# Patient Record
Sex: Female | Born: 1960 | Race: White | Hispanic: No | Smoking: Never smoker
Health system: Southern US, Community
[De-identification: ages and names within clinical notes are randomized; demographics above are authoritative.]

## PROBLEM LIST (undated history)

## (undated) DIAGNOSIS — F909 Attention-deficit hyperactivity disorder, unspecified type: Secondary | ICD-10-CM

## (undated) DIAGNOSIS — I10 Essential (primary) hypertension: Secondary | ICD-10-CM

## (undated) DIAGNOSIS — M1990 Unspecified osteoarthritis, unspecified site: Secondary | ICD-10-CM

## (undated) HISTORY — PX: KNEE SURGERY: SHX244

## (undated) HISTORY — PX: CARPAL TUNNEL RELEASE: SHX101

---

## 1998-03-07 ENCOUNTER — Ambulatory Visit (HOSPITAL_BASED_OUTPATIENT_CLINIC_OR_DEPARTMENT_OTHER): Admission: RE | Admit: 1998-03-07 | Discharge: 1998-03-07 | Payer: Self-pay | Admitting: *Deleted

## 1998-10-23 ENCOUNTER — Ambulatory Visit (HOSPITAL_COMMUNITY): Admission: RE | Admit: 1998-10-23 | Discharge: 1998-10-23 | Payer: Self-pay | Admitting: Obstetrics and Gynecology

## 1998-10-23 ENCOUNTER — Encounter: Payer: Self-pay | Admitting: Obstetrics and Gynecology

## 1998-10-30 ENCOUNTER — Ambulatory Visit (HOSPITAL_COMMUNITY): Admission: RE | Admit: 1998-10-30 | Discharge: 1998-10-30 | Payer: Self-pay | Admitting: Obstetrics and Gynecology

## 1998-10-30 ENCOUNTER — Encounter: Payer: Self-pay | Admitting: Obstetrics and Gynecology

## 1999-12-03 ENCOUNTER — Other Ambulatory Visit: Admission: RE | Admit: 1999-12-03 | Discharge: 1999-12-03 | Payer: Self-pay | Admitting: Obstetrics and Gynecology

## 2000-12-30 ENCOUNTER — Other Ambulatory Visit: Admission: RE | Admit: 2000-12-30 | Discharge: 2000-12-30 | Payer: Self-pay | Admitting: Obstetrics and Gynecology

## 2002-01-31 ENCOUNTER — Other Ambulatory Visit: Admission: RE | Admit: 2002-01-31 | Discharge: 2002-01-31 | Payer: Self-pay | Admitting: Obstetrics and Gynecology

## 2003-02-21 ENCOUNTER — Other Ambulatory Visit: Admission: RE | Admit: 2003-02-21 | Discharge: 2003-02-21 | Payer: Self-pay | Admitting: Obstetrics and Gynecology

## 2004-04-25 ENCOUNTER — Other Ambulatory Visit: Admission: RE | Admit: 2004-04-25 | Discharge: 2004-04-25 | Payer: Self-pay | Admitting: Obstetrics and Gynecology

## 2005-08-04 ENCOUNTER — Other Ambulatory Visit: Admission: RE | Admit: 2005-08-04 | Discharge: 2005-08-04 | Payer: Self-pay | Admitting: Obstetrics and Gynecology

## 2008-07-03 ENCOUNTER — Ambulatory Visit (HOSPITAL_BASED_OUTPATIENT_CLINIC_OR_DEPARTMENT_OTHER): Admission: RE | Admit: 2008-07-03 | Discharge: 2008-07-03 | Payer: Self-pay | Admitting: Family Medicine

## 2008-07-07 ENCOUNTER — Ambulatory Visit: Payer: Self-pay | Admitting: Internal Medicine

## 2008-08-24 ENCOUNTER — Encounter: Admission: RE | Admit: 2008-08-24 | Discharge: 2008-08-24 | Payer: Self-pay | Admitting: Gastroenterology

## 2011-01-27 NOTE — Procedures (Signed)
NAME:  Janet Watson, Janet Watson NO.:  [id number]   MEDICAL RECORD NO.:  [id number]          PATIENT TYPE:  OUT   LOCATION:  SLEEP CENTER                 FACILITY:  [facility]   PHYSICIAN:  Clinton D. Maple Hudson, MD, FCCP, FACPDATE OF BIRTH:  01/30/1963   DATE OF STUDY:  07/03/2008                            NOCTURNAL POLYSOMNOGRAM   REFERRING PHYSICIAN:  Lillia Carmel, M.D.   INDICATION FOR STUDY:  Insomnia with obstructive sleep apnea.  Epworth  sleepiness score 18/24, BMI 39.7, weight 246 pounds, height 66 inches,  and neck 14.5 inches.   MEDICATIONS:  Home medications charted and reviewed.   SLEEP ARCHITECTURE:  Total sleep time 310 minutes with sleep efficiency  81.9%.  Stage I was 3.7%.  Stage II 72.8%.  Stage III 4.5%.  Stage REM  19% of total sleep time.  Sleep latency 9 minutes.  REM latency 65  minutes.  Wake after sleep onset 51 minutes.  Arousal index 6.8.  No  bedtime medication was taken.   RESPIRATORY DATA:  Apnea-hypopnea index (AHI) 0.4 per hour.  A total of  2 events were scored, both hypopneas associated with nonsupine sleep  position while in REM.   OXYGEN DATA:  Moderate snoring with oxygen desaturation to a nadir of  90%.  Mean oxygen saturation through the study was 94.7% on room air.   CARDIAC DATA:  Normal sinus rhythm.   MOVEMENT/PARASOMNIA:  No significant movement disturbance.  No bathroom  trips.   IMPRESSIONS/RECOMMENDATIONS:  1. Sleep architecture unremarkable for sleep center environment noting      that she awoke and did not regain sleep after 4:30 a.m.  Comparison      to home sleep environment and experience may be useful.  2. Insignificant respiratory sleep disturbance, AHI 0.4 per hour      (normal range 0-5 per hour).  Moderate snoring with oxygen      desaturation to a nadir of 90%.      Clinton D. Maple Hudson, MD, [facility], FACP  Diplomate, Biomedical engineer of Sleep Medicine  Electronically Signed     CDY/MEDQ  D:  07/07/2008 11:21:38   T:  07/07/2008 23:14:16  Job:  [id number]

## 2014-01-11 ENCOUNTER — Encounter: Payer: Self-pay | Admitting: Podiatry

## 2014-01-11 ENCOUNTER — Ambulatory Visit (INDEPENDENT_AMBULATORY_CARE_PROVIDER_SITE_OTHER): Payer: 59 | Admitting: Podiatry

## 2014-01-11 VITALS — BP 122/81 | HR 86 | Resp 16

## 2014-01-11 DIAGNOSIS — B351 Tinea unguium: Secondary | ICD-10-CM

## 2014-01-11 NOTE — Progress Notes (Deleted)
Subjective:      Patient ID: Janet Watson is a 53 y.o. female.  Chief Complaint: HPI ROS    Objective:    Physical Exam  Lab Review:     Assessment:     Dermatophytosis of nail   Plan:     ***

## 2014-01-11 NOTE — Progress Notes (Signed)
   Subjective:    Patient ID: Janet Watson, female    DOB: [birth year], 53 y.o.   MRN: [id number]  HPI Comments: "I have these bad toenails"  Patient c/o thick and discolored 1st and 3rd toenails left for several years. Did injury the toes years ago by dropping a scuba tank on them. The 1st left, medial border, is numb. She has been trimming them down and using tea tree oil.  Interested in laser treatment      Review of Systems  HENT: Positive for sinus pressure, sneezing and tinnitus.   Skin:       Change in nails  Allergic/Immunologic: Positive for environmental allergies and food allergies.  All other systems reviewed and are negative.      Objective:   Physical Exam        Assessment & Plan:

## 2014-01-12 NOTE — Progress Notes (Signed)
Subjective:     Patient ID: Janet Watson, female   DOB: [birth year], 53 y.o.   MRN: [id number]  HPI patient presents stating I have these nails on my left foot that are thick and I'm not sure whether we can do anything about a   Review of Systems  All other systems reviewed and are negative.      Objective:   Physical Exam  Nursing note and vitals reviewed. Constitutional: She is oriented to person, place, and time.  Cardiovascular: Intact distal pulses.   Musculoskeletal: Normal range of motion.  Neurological: She is oriented to person, place, and time.  Skin: Skin is warm.   neurovascular status found to be intact with patient well oriented and found to have normal muscle strength of the subtalar and midtarsal joint and found to have normal range of motion. Nailbeds 1 and 3 left are mildly thickened but it does appear to be trauma versus true fungal infiltration     Assessment:     Probable trauma to that nailbeds creating discoloration    Plan:     H&P reviewed and today I advised on causes a traumatic nail along with opportunistic fungus. We are going to utilize laser treatment the hallux and third nail on the left foot and I reviewed this with her explaining it may or may not give her relief and curing of her symptoms. Also begin formula 3

## 2014-01-29 ENCOUNTER — Encounter: Payer: Self-pay | Admitting: Podiatry

## 2014-01-29 ENCOUNTER — Ambulatory Visit: Payer: Self-pay | Admitting: Podiatry

## 2014-01-29 VITALS — BP 124/86 | HR 84 | Resp 12

## 2014-01-29 DIAGNOSIS — B351 Tinea unguium: Secondary | ICD-10-CM

## 2014-01-29 NOTE — Progress Notes (Signed)
Subjective:     Patient ID: Janet Watson, female   DOB: August 29, 1961, 53 y.o.   MRN: [id number]  HPI patient presents with nail disease of the big nail and third nail left foot   Review of Systems     Objective:   Physical Exam Neurovascular status intact with thickened big nail third nail left    Assessment:     Mycotic nail and trauma to the hallux and third nail left    Plan:     Laser accomplished today with no issues and reappoint 5 weeks to reevaluate

## 2014-03-05 ENCOUNTER — Ambulatory Visit (INDEPENDENT_AMBULATORY_CARE_PROVIDER_SITE_OTHER): Payer: 59 | Admitting: Podiatry

## 2014-03-05 ENCOUNTER — Encounter: Payer: Self-pay | Admitting: Podiatry

## 2014-03-05 VITALS — BP 157/97 | HR 75 | Resp 16

## 2014-03-05 DIAGNOSIS — B351 Tinea unguium: Secondary | ICD-10-CM

## 2014-03-05 NOTE — Progress Notes (Signed)
Subjective:     Patient ID: Janet Watson, female   DOB: 12-Nov-1960, 53 y.o.   MRN: [id number]  HPI patient presents stating my big toenail and third nail left I think are improving but still have fungal element present   Review of Systems     Objective:   Physical Exam Neurovascular status intact with some yellow numbness to the hallux and third nail left    Assessment:     Continued mycotic infection    Plan:     Today laser applied approximately 800 pulses to the hallux and third nail which was tolerated well

## 2014-06-20 ENCOUNTER — Ambulatory Visit: Payer: 59 | Admitting: Podiatry

## 2014-06-20 DIAGNOSIS — B351 Tinea unguium: Secondary | ICD-10-CM

## 2014-06-20 NOTE — Progress Notes (Signed)
Subjective:     Patient ID: Janet Watson, female   DOB: November 12, 1960, 53 y.o.   MRN: [id number]  HPI patient states that her nails are doing better with a small amount of fungus on the big nail and third nail left   Review of Systems     Objective:   Physical Exam Neurovascular status intact with slight discoloration of the hallux and third nail left foot improved from previous visit    Assessment:     Doing well with nail laser surgery    Plan:     Approximate 1000 pulses to the hallux and third nail left and reappoint as needed

## 2014-09-13 DIAGNOSIS — M79673 Pain in unspecified foot: Secondary | ICD-10-CM

## 2014-10-26 ENCOUNTER — Encounter (HOSPITAL_COMMUNITY): Payer: Self-pay | Admitting: Emergency Medicine

## 2014-10-26 ENCOUNTER — Emergency Department (HOSPITAL_COMMUNITY): Payer: 59

## 2014-10-26 ENCOUNTER — Emergency Department (HOSPITAL_COMMUNITY)
Admission: EM | Admit: 2014-10-26 | Discharge: 2014-10-26 | Disposition: A | Discharge status: Home / Self Care | Payer: 59 | Attending: Emergency Medicine | Admitting: Emergency Medicine

## 2014-10-26 DIAGNOSIS — Y998 Other external cause status: Secondary | ICD-10-CM | POA: Diagnosis not present

## 2014-10-26 DIAGNOSIS — Y9289 Other specified places as the place of occurrence of the external cause: Secondary | ICD-10-CM | POA: Diagnosis not present

## 2014-10-26 DIAGNOSIS — Y9389 Activity, other specified: Secondary | ICD-10-CM | POA: Diagnosis not present

## 2014-10-26 DIAGNOSIS — F909 Attention-deficit hyperactivity disorder, unspecified type: Secondary | ICD-10-CM | POA: Insufficient documentation

## 2014-10-26 DIAGNOSIS — W230XXA Caught, crushed, jammed, or pinched between moving objects, initial encounter: Secondary | ICD-10-CM | POA: Diagnosis not present

## 2014-10-26 DIAGNOSIS — Z79899 Other long term (current) drug therapy: Secondary | ICD-10-CM | POA: Diagnosis not present

## 2014-10-26 DIAGNOSIS — S61216A Laceration without foreign body of right little finger without damage to nail, initial encounter: Secondary | ICD-10-CM | POA: Diagnosis not present

## 2014-10-26 DIAGNOSIS — Z8739 Personal history of other diseases of the musculoskeletal system and connective tissue: Secondary | ICD-10-CM | POA: Diagnosis not present

## 2014-10-26 DIAGNOSIS — S6991XA Unspecified injury of right wrist, hand and finger(s), initial encounter: Secondary | ICD-10-CM | POA: Diagnosis present

## 2014-10-26 DIAGNOSIS — I10 Essential (primary) hypertension: Secondary | ICD-10-CM | POA: Insufficient documentation

## 2014-10-26 HISTORY — DX: Unspecified osteoarthritis, unspecified site: M19.90

## 2014-10-26 HISTORY — DX: Attention-deficit hyperactivity disorder, unspecified type: F90.9

## 2014-10-26 HISTORY — DX: Essential (primary) hypertension: I10

## 2014-10-26 MED ORDER — SULFAMETHOXAZOLE-TRIMETHOPRIM 800-160 MG PO TABS: 1.0000 | ORAL_TABLET | Freq: Two times a day (BID) | ORAL | Status: DC

## 2014-10-26 MED ORDER — LIDOCAINE HCL (PF) 1 % IJ SOLN
5.0000 mL | Freq: Once | INTRAMUSCULAR | Status: AC
  Administered 2014-10-26: 5 mL
  Filled 2014-10-26: qty 5

## 2014-10-26 NOTE — ED Provider Notes (Signed)
CSN: [id number]     Arrival date & time 10/26/14  1019 History  This chart was scribed for Cheron SchaumannLeslie Brevin Mcfadden, VF CorporationPA-C, working with Toy CookeyMegan Docherty, MD by Chestine SporeSoijett Blue, ED Scribe. The patient was seen in room TR09C/TR09C at 10:29 AM.    Chief Complaint  Patient presents with  . Hand Injury     The history is provided by the patient. No language interpreter was used.    HPI Comments: Janet Smackatricia Glassberg is a 54 y.o. female who presents to the Emergency Department complaining of right hand inury onset last night around 10 PM. She reports that she got her hand caught in a car door and she yanked it out. She went to visit her PCP today for the injury. She notes that [facility] She notes that they referred her because of the possibility of joint involvement. She states that she is having associated symptoms of arthralgia and joint swelling. She denies fever, chills, and any other symptoms. 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   Past Medical History  Diagnosis Date  . Hypertension   . ADHD (attention deficit hyperactivity disorder)   . Arthritis    History reviewed. No pertinent past surgical history. No family history on file. History  Substance Use Topics  . Smoking status: Never Smoker   . Smokeless tobacco: Not on file  . Alcohol Use: No   OB History    No data available     Review of Systems  Constitutional: Negative for fever and chills.  Musculoskeletal: Positive for joint swelling and arthralgias.  All other systems reviewed and are negative.    Allergies  Erythromycin  Home Medications   Prior to Admission medications   Medication Sig Start Date End Date Taking? Authorizing Provider  COD LIVER OIL PO Take by mouth.    Historical Provider, MD  hydrochlorothiazide (MICROZIDE) 12.5 MG capsule Take 12.5 mg by mouth daily.    Historical Provider, MD  Hyoscyamine Sulfate (HYOMAX PO) Take by mouth.    Historical Provider, MD  Levothyroxine  Sodium (LEVOTHROID PO) Take by mouth.    Historical Provider, MD  lisdexamfetamine (VYVANSE) 40 MG capsule Take 40 mg by mouth every morning.    Historical Provider, MD  TURMERIC PO Take by mouth.    Historical Provider, MD   BP 152/93 mmHg  Pulse 94  Temp(Src) 97.7 F (36.5 C) (Oral)  SpO2 100%  Physical Exam  Constitutional: She is oriented to person, place, and time. She appears well-developed and well-nourished. No distress.  HENT:  Head: Normocephalic and atraumatic.  Eyes: EOM are normal.  Neck: Neck supple. No tracheal deviation present.  Cardiovascular: Normal rate.   Pulmonary/Chest: Effort normal. No respiratory distress.  Musculoskeletal: Normal range of motion.       Right hand: She exhibits laceration. She exhibits normal range of motion. Normal sensation noted.  Right hand: Triangular shaped laceration dorsal aspect right fifth finger at MCP and web space. NVI. Sensation intact. Full ROM. No evidence of tendon injury.  Neurological: She is alert and oriented to person, place, and time.  Skin: Skin is warm and dry.  Psychiatric: She has a normal mood and affect. Her behavior is normal.  Nursing note and vitals reviewed.   ED Course  LACERATION REPAIR Date/Time: 10/26/2014 11:43 AM Performed by: Elson AreasSOFIA, Kyriaki Moder K Authorized by: Elson AreasSOFIA, Rishon Thilges K Consent: Verbal consent obtained. Consent given by: patient Patient understanding: patient does not state understanding of the procedure being  performed Patient identity confirmed: verbally with patient Time out: Immediately prior to procedure a "time out" was called to verify the correct patient, procedure, equipment, support staff and site/side marked as required. Laceration length: 1 cm Foreign bodies: glass Tendon involvement: none Nerve involvement: none Vascular damage: no Anesthesia: local infiltration Local anesthetic: lidocaine 2% without epinephrine Preparation: Patient was prepped and draped in the usual sterile  fashion. Irrigation solution: saline Debridement: none Degree of undermining: none Skin closure: 5-0 Prolene Number of sutures: 2 Approximation: loose Approximation difficulty: simple Patient tolerance: Patient tolerated the procedure well with no immediate complications   (including critical care time) DIAGNOSTIC STUDIES: Oxygen Saturation is 100% on room air, normal by my interpretation.    COORDINATION OF CARE: 10:32 AM-Discussed treatment plan which includes right hand X-ray, laceration repair, abx with pt at bedside and pt agreed to plan.   Labs Review Labs Reviewed - No data to display  Imaging Review Dg Finger Little Right  10/26/2014   CLINICAL DATA:  Finger slammed in car door. Laceration over the MCP joint. Swelling and discomfort.  EXAM: RIGHT LITTLE FINGER 2+V  COMPARISON:  None.  FINDINGS: Negative for a fracture or dislocation. There is a small calcification or density along the ulnar aspect of the ring finger proximal phalanx. No significant degenerative disease. Difficult to evaluate for soft tissue swelling.  IMPRESSION: No acute bone abnormality.   Electronically Signed   By: Richarda Overlie M.D.   On: 10/26/2014 10:58     EKG Interpretation None      MDM   Final diagnoses:  Laceration of right little finger without foreign body without damage to nail, initial encounter     I personally performed the services described in this documentation, which was scribed in my presence. The recorded information has been reviewed and is accurate.    Lonia Skinner Cantrall, PA-C 10/26/14 1141  Lonia Skinner Tiro, PA-C 10/26/14 1146  Toy Cookey, MD 10/29/14 1024

## 2014-10-26 NOTE — Discharge Instructions (Signed)

## 2014-10-26 NOTE — ED Notes (Signed)
Patient states slammed her hand in car door last night around 1000pm.   Patient has open laceration just below pinky finger on R hand.   Patient [facility] and was sent here by her primary because "they said I had joint involvement and needed a hand specialist".

## 2015-02-26 ENCOUNTER — Ambulatory Visit (INDEPENDENT_AMBULATORY_CARE_PROVIDER_SITE_OTHER): Payer: 59 | Admitting: Podiatry

## 2015-02-26 ENCOUNTER — Ambulatory Visit: Payer: 59

## 2015-02-26 ENCOUNTER — Encounter: Payer: Self-pay | Admitting: Podiatry

## 2015-02-26 VITALS — BP 133/89 | HR 89 | Resp 12

## 2015-02-26 DIAGNOSIS — M205X1 Other deformities of toe(s) (acquired), right foot: Secondary | ICD-10-CM | POA: Diagnosis not present

## 2015-02-26 DIAGNOSIS — M722 Plantar fascial fibromatosis: Secondary | ICD-10-CM

## 2015-02-26 MED ORDER — TRIAMCINOLONE ACETONIDE 10 MG/ML IJ SUSP
10.0000 mg | Freq: Once | INTRAMUSCULAR | Status: AC
  Administered 2015-02-26: 10 mg

## 2015-02-26 NOTE — Progress Notes (Signed)
   Subjective:    Patient ID: Janet Watson, female    DOB: [birth year], 54 y.o.   MRN: [id number]  HPI PT STATED SEEN DR.PETRINITZ AND DIAGNOSED WITH PLANTAR FASCIITIS ON B/L FEET START HURTING FOR 3 MONTHS. FEET ARE GETTING WORSE ESPECIALLY WHEN WALKING. TRIED WEARING THE ORTHOTICS BUT NO HELP.   Review of Systems  Musculoskeletal: Positive for gait problem.       Objective:   Physical Exam        Assessment & Plan:

## 2015-02-26 NOTE — Patient Instructions (Signed)
Plantar Fasciitis  Plantar fasciitis is a common condition that causes foot pain. It is soreness (inflammation) of the band of tough fibrous tissue on the bottom of the foot that runs from the heel bone (calcaneus) to the ball of the foot. The cause of this soreness may be from excessive standing, poor fitting shoes, running on hard surfaces, being overweight, having an abnormal walk, or overuse (this is common in runners) of the painful foot or feet. It is also common in aerobic exercise dancers and ballet dancers.  SYMPTOMS   Most people with plantar fasciitis complain of:   Severe pain in the morning on the bottom of their foot especially when taking the first steps out of bed. This pain recedes after a few minutes of walking.   Severe pain is experienced also during walking following a long period of inactivity.   Pain is worse when walking barefoot or up stairs  DIAGNOSIS    Your caregiver will diagnose this condition by examining and feeling your foot.   Special tests such as X-rays of your foot, are usually not needed.  PREVENTION    Consult a sports medicine professional before beginning a new exercise program.   Walking programs offer a good workout. With walking there is a lower chance of overuse injuries common to runners. There is less impact and less jarring of the joints.   Begin all new exercise programs slowly. If problems or pain develop, decrease the amount of time or distance until you are at a comfortable level.   Wear good shoes and replace them regularly.   Stretch your foot and the heel cords at the back of the ankle (Achilles tendon) both before and after exercise.   Run or exercise on even surfaces that are not hard. For example, asphalt is better than pavement.   Do not run barefoot on hard surfaces.   If using a treadmill, vary the incline.   Do not continue to workout if you have foot or joint problems. Seek professional help if they do not improve.  HOME CARE INSTRUCTIONS     Avoid activities that cause you pain until you recover.   Use ice or cold packs on the problem or painful areas after working out.   Only take over-the-counter or prescription medicines for pain, discomfort, or fever as directed by your caregiver.   Soft shoe inserts or athletic shoes with air or gel sole cushions may be helpful.   If problems continue or become more severe, consult a sports medicine caregiver or your own health care provider. Cortisone is a potent anti-inflammatory medication that may be injected into the painful area. You can discuss this treatment with your caregiver.  MAKE SURE YOU:    Understand these instructions.   Will watch your condition.   Will get help right away if you are not doing well or get worse.  Document Released: 05/26/2001 Document Revised: 11/23/2011 Document Reviewed: 07/25/2008  ExitCare Patient Information 2015 [employer]. This information is not intended to replace advice given to you by your health care provider. Make sure you discuss any questions you have with your health care provider.

## 2015-02-26 NOTE — Progress Notes (Signed)
Subjective:     Patient ID: Janet Watson, female   DOB: 21-Mar-1961, 54 y.o.   MRN: [id number]  HPI patient presents stating that she's getting pains in the bottom of her heels and that she's had pain in her toe joint right over left for a period of time. She is worn orthotics in the past which have worn out and she needs a new pair   Review of Systems  All other systems reviewed and are negative.      Objective:   Physical Exam  Constitutional: She is oriented to person, place, and time.  Cardiovascular: Intact distal pulses.   Musculoskeletal: Normal range of motion.  Neurological: She is oriented to person, place, and time.  Skin: Skin is warm.  Nursing note and vitals reviewed.  neurovascular status intact muscle strength adequate range of motion within normal limits. Patient's noted to have discomfort in the plantar aspect heel right over left with inflammation and fluid buildup and is noted to have limitation of motion first metatarsophalangeal joint right with some crepitus within the joint. 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 Today I injected the plantar fascia bilateral 3 mg Kenalog 5 mg Xylocaine and dispensed fascial brace bilateral. I then scanned for custom orthotic devices and we will see her back 3 weeks to dispense and possibly to reevaluate

## 2015-03-19 ENCOUNTER — Ambulatory Visit: Payer: 59 | Admitting: *Deleted

## 2015-03-19 DIAGNOSIS — M722 Plantar fascial fibromatosis: Secondary | ICD-10-CM

## 2015-03-19 NOTE — Patient Instructions (Signed)

## 2015-03-19 NOTE — Progress Notes (Signed)
Patient ID: Janet Watson, female   DOB: May 22, 1961, 54 y.o.   MRN: [id number] Patient presents for orthotic pick up.  Written and verbal break in and wear instructions given.

## 2015-07-16 ENCOUNTER — Emergency Department (HOSPITAL_COMMUNITY)
Admission: EM | Admit: 2015-07-16 | Discharge: 2015-07-16 | Disposition: A | Discharge status: Home / Self Care | Payer: 59 | Attending: Emergency Medicine | Admitting: Emergency Medicine

## 2015-07-16 ENCOUNTER — Encounter (HOSPITAL_COMMUNITY): Payer: Self-pay | Admitting: Cardiology

## 2015-07-16 ENCOUNTER — Emergency Department (HOSPITAL_COMMUNITY): Payer: 59

## 2015-07-16 DIAGNOSIS — Z792 Long term (current) use of antibiotics: Secondary | ICD-10-CM | POA: Diagnosis not present

## 2015-07-16 DIAGNOSIS — I10 Essential (primary) hypertension: Secondary | ICD-10-CM | POA: Diagnosis not present

## 2015-07-16 DIAGNOSIS — Y93E1 Activity, personal bathing and showering: Secondary | ICD-10-CM | POA: Diagnosis not present

## 2015-07-16 DIAGNOSIS — Y929 Unspecified place or not applicable: Secondary | ICD-10-CM | POA: Insufficient documentation

## 2015-07-16 DIAGNOSIS — W182XXA Fall in (into) shower or empty bathtub, initial encounter: Secondary | ICD-10-CM | POA: Insufficient documentation

## 2015-07-16 DIAGNOSIS — S76912A Strain of unspecified muscles, fascia and tendons at thigh level, left thigh, initial encounter: Secondary | ICD-10-CM | POA: Insufficient documentation

## 2015-07-16 DIAGNOSIS — F909 Attention-deficit hyperactivity disorder, unspecified type: Secondary | ICD-10-CM | POA: Diagnosis not present

## 2015-07-16 DIAGNOSIS — Z79899 Other long term (current) drug therapy: Secondary | ICD-10-CM | POA: Insufficient documentation

## 2015-07-16 DIAGNOSIS — Y999 Unspecified external cause status: Secondary | ICD-10-CM | POA: Insufficient documentation

## 2015-07-16 DIAGNOSIS — S79912A Unspecified injury of left hip, initial encounter: Secondary | ICD-10-CM | POA: Diagnosis present

## 2015-07-16 DIAGNOSIS — S76219A Strain of adductor muscle, fascia and tendon of unspecified thigh, initial encounter: Secondary | ICD-10-CM

## 2015-07-16 DIAGNOSIS — Z8739 Personal history of other diseases of the musculoskeletal system and connective tissue: Secondary | ICD-10-CM | POA: Diagnosis not present

## 2015-07-16 MED ORDER — CYCLOBENZAPRINE HCL 10 MG PO TABS: 10.0000 mg | ORAL_TABLET | Freq: Two times a day (BID) | ORAL | Status: DC | PRN

## 2015-07-16 MED ORDER — IBUPROFEN 800 MG PO TABS: 800.0000 mg | ORAL_TABLET | Freq: Three times a day (TID) | ORAL | Status: DC

## 2015-07-16 NOTE — ED Provider Notes (Signed)
CSN: [id number]     Arrival date & time 07/16/15  1024 History  By signing my name below, I, Janet Watson, attest that this documentation has been prepared under the direction and in the presence of non-physician practitioner, Teressa LowerVrinda Madge Therrien, NP. Electronically Signed: Freida Busmaniana Watson, Scribe. 07/16/2015. 10:55 AM.  Chief Complaint  Patient presents with  . Fall  . Leg Pain   The history is provided by the patient. No language interpreter was used.    HPI Comments:  Janet Watson is a 54 y.o. female who presents to the Emergency Department complaining of constant moderate pain to her left buttock that radiates down her posterior LLE s/p fall last night. Pt states she fell in the shower and landed in a vertical split in the tub. Pt notes she struck her head during the fall but denies HA and LOC. Pt notes her pain is exacerbated when seated and when bending. No alleviating factors noted.  Past Medical History  Diagnosis Date  . Hypertension   . ADHD (attention deficit hyperactivity disorder)   . Arthritis    History reviewed. No pertinent past surgical history. History reviewed. No pertinent family history. Social History  Substance Use Topics  . Smoking status: Never Smoker   . Smokeless tobacco: None  . Alcohol Use: No   OB History    No data available     Review of Systems  Constitutional: Negative for fever and chills.  Respiratory: Negative for shortness of breath.   Cardiovascular: Negative for chest pain.  Musculoskeletal: Positive for myalgias.       Left Buttock and LLE    Allergies  Erythromycin  Home Medications   Prior to Admission medications   Medication Sig Start Date End Date Taking? Authorizing Provider  COD LIVER OIL PO Take by mouth.    Historical Provider, MD  hydrochlorothiazide (MICROZIDE) 12.5 MG capsule Take 12.5 mg by mouth daily.    Historical Provider, MD  Hyoscyamine Sulfate (HYOMAX PO) Take by mouth.    Historical Provider, MD   Levothyroxine Sodium (LEVOTHROID PO) Take by mouth.    Historical Provider, MD  lisdexamfetamine (VYVANSE) 40 MG capsule Take 40 mg by mouth every morning.    Historical Provider, MD  sulfamethoxazole-trimethoprim (SEPTRA DS) 800-160 MG per tablet Take 1 tablet by mouth every 12 (twelve) hours. 10/26/14   Elson AreasLeslie K Sofia, PA-C  TURMERIC PO Take by mouth.    Historical Provider, MD   BP 147/89 mmHg  Pulse 110  Temp(Src) 98.2 F (36.8 C) (Oral)  Resp 18  Wt 230 lb (104.327 kg)  SpO2 98% Physical Exam  Constitutional: She is oriented to person, place, and time. She appears well-developed and well-nourished. No distress.  HENT:  Head: Normocephalic and atraumatic.  Eyes: Conjunctivae are normal.  Cardiovascular: Normal rate.   Pulmonary/Chest: Effort normal and breath sounds normal.  Abdominal: Soft. Bowel sounds are normal. She exhibits no distension.  Musculoskeletal:  Tender in the left hip with external rotation. No shortening noted. Neurovascularly intact  Neurological: She is alert and oriented to person, place, and time. She exhibits normal muscle tone. Coordination normal.  Skin: Skin is warm and dry.  Psychiatric: She has a normal mood and affect.  Nursing note and vitals reviewed.   ED Course  Procedures   DIAGNOSTIC STUDIES:  Oxygen Saturation is 98% on RA, normal by my interpretation.    COORDINATION OF CARE:  11:00 AM Will order XR of the left hip. Discussed treatment plan with pt at  bedside and pt agreed to plan.  Labs Review Labs Reviewed - No data to display  Imaging Review Dg Hip Unilat With Pelvis 2-3 Views Left  07/16/2015  CLINICAL DATA:  Acute left hip pain after falling in shower last night. Initial encounter. EXAM: DG HIP (WITH OR WITHOUT PELVIS) 2-3V LEFT COMPARISON:  None. FINDINGS: There is no evidence of hip fracture or dislocation. Degenerative changes seen involving the pubic symphysis. No degenerative changes are seen involving the left hip.  IMPRESSION: Normal left hip. Electronically Signed   By: Lupita Raider, M.D.   On: 07/16/2015 12:29   I have personally reviewed and evaluated these images and lab results as part of my medical decision-making.   EKG Interpretation None      MDM   Final diagnoses:  Groin strain, initial encounter     no acute bony injury. Pt is moving without any problem. Likely strain. Will do flexeril and ibuprofen for symptomatic relief  I personally performed the services described in this documentation, which was scribed in my presence. The recorded information has been reviewed and is accurate.   Teressa Lower, NP 07/16/15 1243  Teressa Lower, NP 07/16/15 1244  Benjiman Core, MD 07/17/15 [phone]

## 2015-07-16 NOTE — ED Notes (Signed)
Nurse was unable to get patient to e sign because  Janet Watson was in patient chart.  Was unable to find this person.  Pt verbalized understanding instructions.

## 2015-07-16 NOTE — Discharge Instructions (Signed)
Muscle Strain °A muscle strain (pulled muscle) happens when a muscle is stretched beyond normal length. It happens when a sudden, violent force stretches your muscle too far. Usually, a few of the fibers in your muscle are torn. Muscle strain is common in athletes. Recovery usually takes 1-2 weeks. Complete healing takes 5-6 weeks.  °HOME CARE  °· Follow the PRICE method of treatment to help your injury get better. Do this the first 2-3 days after the injury: °¨ Protect. Protect the muscle to keep it from getting injured again. °¨ Rest. Limit your activity and rest the injured body part. °¨ Ice. Put ice in a plastic bag. Place a towel between your skin and the bag. Then, apply the ice and leave it on from 15-20 minutes each hour. After the third day, switch to moist heat packs. °¨ Compression. Use a splint or elastic bandage on the injured area for comfort. Do not put it on too tightly. °¨ Elevate. Keep the injured body part above the level of your heart. °· Only take medicine as told by your doctor. °· Warm up before doing exercise to prevent future muscle strains. °GET HELP IF:  °· You have more pain or puffiness (swelling) in the injured area. °· You feel numbness, tingling, or notice a loss of strength in the injured area. °MAKE SURE YOU:  °· Understand these instructions. °· Will watch your condition. °· Will get help right away if you are not doing well or get worse. °  °This information is not intended to replace advice given to you by your health care provider. Make sure you discuss any questions you have with your health care provider. °  °Document Released: 06/09/2008 Document Revised: 06/21/2013 Document Reviewed: 03/30/2013 °Elsevier Interactive Patient Education ©2016 Else[employer] Inc[employer] ° °

## 2015-07-16 NOTE — ED Notes (Signed)
Reports she fell in the shower last night and hurt her left leg. Denies any LOC.

## 2015-07-19 ENCOUNTER — Ambulatory Visit (INDEPENDENT_AMBULATORY_CARE_PROVIDER_SITE_OTHER): Payer: 59 | Admitting: Physician Assistant

## 2015-07-19 VITALS — BP 126/82 | HR 87 | Temp 97.9°F | Resp 18 | Ht 66.0 in | Wt 232.0 lb

## 2015-07-19 DIAGNOSIS — F988 Other specified behavioral and emotional disorders with onset usually occurring in childhood and adolescence: Secondary | ICD-10-CM | POA: Insufficient documentation

## 2015-07-19 DIAGNOSIS — M722 Plantar fascial fibromatosis: Secondary | ICD-10-CM | POA: Insufficient documentation

## 2015-07-19 DIAGNOSIS — S76312A Strain of muscle, fascia and tendon of the posterior muscle group at thigh level, left thigh, initial encounter: Secondary | ICD-10-CM

## 2015-07-19 DIAGNOSIS — E039 Hypothyroidism, unspecified: Secondary | ICD-10-CM | POA: Insufficient documentation

## 2015-07-19 DIAGNOSIS — I10 Essential (primary) hypertension: Secondary | ICD-10-CM | POA: Insufficient documentation

## 2015-07-19 NOTE — Progress Notes (Signed)
Subjective:   Patient ID: Janet Watson, female     DOB: October 19, 1960, 54 y.o.    MRN: [id number]  PCP: Londell Moh, MD  Chief Complaint  Patient presents with  . Fall    monday night in the tube was seen in ER 11/1  . Hospitalization Follow-up    11/1  . Leg Pain    lower left leg   . Bleeding/Bruising    left leg     HPI  Presents for evaluation of LEFT leg pain.  On 07/15/2015 in the shower she dropped the soap. In an attempt to catch it, she slipped. Her LEFT leg went out in front of her and up the slope at the back of the tub while her RIGHT leg went out behind her, in a split position. Her head did "hit" the side of the tub, but very gently, and she denies any headache or tenderness, and had no change in level of consciousness. She had immediate pain in the back of the upper thigh on the LEFT. She was home alone and it took her some time to be able to get up and out of the tub, and dressed and able to go to the ED for evaluation.  Once in the ED, she reports that the person evaluating her didn't listen to her and didn't examine her. She was told she had a groin strain and prescribed Ibuprofen 800 mg and cyclobenzaprine. This was concerning to her, since she already takes Lodine for plantar fasciitis. She isn't using the ibuprofen, nor the cyclobenzaprine which made her so sleepy that she over slept the next morning. She describes feeling frustrated with her experience in the ED.  She presents here today with continued pain in the posterior LEFT thigh, where she has developed a large bruise. A colleague suggested that she might have a blood clot, and she wants to have that checked out.  The area is painful, especially with sit to stand, stand to sit and while sitting (especially when the edge of the chair or toilet presses at the site of the bruise. There is no swelling, and it doesn't feel hot to the touch. She can stand and weight bear.     Prior to Admission  medications   Medication Sig Start Date End Date Taking? Authorizing Provider  COD LIVER OIL PO Take by mouth.   Yes Historical Provider, MD  etodolac (LODINE) 500 MG tablet Take 500 mg by mouth 2 (two) times daily as needed.   Yes Historical Provider, MD  hyoscyamine (LEVSIN, ANASPAZ) 0.125 MG tablet Take 0.125 mg by mouth every 4 (four) hours as needed.   Yes Historical Provider, MD  irbesartan-hydrochlorothiazide (AVALIDE) 150-12.5 MG tablet Take 1 tablet by mouth daily.   Yes Historical Provider, MD  levothyroxine (SYNTHROID, LEVOTHROID) 50 MCG tablet Take 50 mcg by mouth daily before breakfast.   Yes Historical Provider, MD  lisdexamfetamine (VYVANSE) 40 MG capsule Take 40 mg by mouth every morning.   Yes Historical Provider, MD  TURMERIC PO Take by mouth.   Yes Historical Provider, MD  vitamin C (ASCORBIC ACID) 250 MG tablet Take 250 mg by mouth daily.   Yes Historical Provider, MD  cyclobenzaprine (FLEXERIL) 10 MG tablet Take 1 tablet (10 mg total) by mouth 2 (two) times daily as needed for muscle spasms. Patient not taking: Reported on 07/19/2015 07/16/15   Teressa Lower, NP  ibuprofen (ADVIL,MOTRIN) 800 MG tablet Take 1 tablet (800 mg total) by mouth 3 (three)  times daily. Patient not taking: Reported on 07/19/2015 07/16/15   Teressa Lower, NP     Allergies  Allergen Reactions  . Erythromycin Nausea And Vomiting     Patient Active Problem List   Diagnosis Date Noted  . Benign essential HTN 07/19/2015  . Hypothyroidism 07/19/2015  . ADD (attention deficit disorder) 07/19/2015  . Plantar fasciitis 07/19/2015     Family History  Problem Relation Age of Onset  . Heart disease Mother   . Hyperlipidemia Mother   . Hypertension Mother   . Mental illness Mother   . Hypertension Father   . Heart disease Maternal Uncle      Social History   Social History  . Marital Status: Divorced    Spouse Name: n/a  . Number of Children: 0  . Years of Education: College    Occupational History  . CSI     [employer]   Social History Main Topics  . Smoking status: Never Smoker   . Smokeless tobacco: Never Used  . Alcohol Use: 0.0 - 2.4 oz/week    0-4 Standard drinks or equivalent per week     Comment: most often, doesn't drink  . Drug Use: No  . Sexual Activity: Not on file   Other Topics Concern  . Not on file   Social History Narrative   Lives alone.        Review of Systems As above. No CP, SOB, HA, Dizziness.      Objective:  Physical Exam  Constitutional: She is oriented to person, place, and time. Vital signs are normal. She appears well-developed and well-nourished. She is active and cooperative. No distress.  BP 126/82 mmHg  Pulse 87  Temp(Src) 97.9 F (36.6 C) (Oral)  Resp 18  Ht  (1.676 m)  Wt 232 lb (105.235 kg)  BMI 37.46 kg/m2  SpO2 97%  HENT:  Head: Normocephalic and atraumatic.  Right Ear: Hearing normal.  Left Ear: Hearing normal.  Eyes: Conjunctivae are normal. No scleral icterus.  Neck: Normal range of motion. Neck supple. No thyromegaly present.  Cardiovascular: Normal rate, regular rhythm and normal heart sounds.   Pulses:      Radial pulses are 2+ on the right side, and 2+ on the left side.  Pulmonary/Chest: Effort normal and breath sounds normal.  Musculoskeletal:       Right hip: Normal.       Left hip: Normal.       Right knee: Normal.       Left knee: Normal.       Right upper leg: Normal.       Left upper leg: She exhibits tenderness. She exhibits no bony tenderness, no swelling, no edema, no deformity and no laceration.       Right lower leg: Normal.       Left lower leg: Normal.       Legs: Lymphadenopathy:       Head (right side): No tonsillar, no preauricular, no posterior auricular and no occipital adenopathy present.       Head (left side): No tonsillar, no preauricular, no posterior auricular and no occipital adenopathy present.    She has no cervical  adenopathy.       Right: No supraclavicular adenopathy present.       Left: No supraclavicular adenopathy present.  Neurological: She is alert and oriented to person, place, and time. No sensory deficit.  Skin: Skin is warm, dry and intact. Ecchymosis noted. No  abrasion, no burn, no laceration, no lesion, no petechiae and no rash noted. No cyanosis or erythema. Nails show no clubbing.  Psychiatric: She has a normal mood and affect. Her speech is normal and behavior is normal.             Assessment & Plan:  1. Hamstring strain, left, initial encounter No increased warmth, swelling or erythema of the legs. Not tender in the hip/groin area. Tender in the area of the ecchymosis. Encouraged continued NSAIDS. Try 5 mg of cyclobenzaprine to see if that helps, but doesn't make her too sleepy. Heat application. Rest.    Fernande Brashelle S. Raed Schalk, PA-C Physician Assistant-Certified [facility]

## 2015-07-19 NOTE — Patient Instructions (Signed)
Hamstring Strain A hamstring strain is an injury that occurs when the hamstring muscles are overstretched or overloaded. The hamstring muscles are a group of muscles at the back of the thighs. These muscles are used in straightening the hips, bending the knees, and pulling back the legs. This type of injury is often called a pulled hamstring muscle. The severity of a muscle strain is rated in degrees. First-degree strains have the least amount of muscle fiber tearing and pain. Second-degree and third-degree strains have increasingly more tearing and pain. CAUSES Hamstring strains occur when a sudden, violent force is placed on these muscles and stretches them too far. This often occurs during activities that involve running, jumping, kicking, or weight lifting. RISK FACTORS Hamstring strains are especially common in athletes. Other things that can increase your risk for this injury include:  Having low strength, endurance, or flexibility of the hamstring muscles.  Performing high-impact physical activity.  Having poor physical fitness.  Having a previous leg injury.  Having fatigued muscles.  Older age. SIGNS AND SYMPTOMS  Pain in the back of the thigh.  Bruising.  Swelling.  Muscle spasm.  Difficulty using the muscle because of pain or lack of normal function. For severe strains, you may have a popping or snapping feeling when the injury occurs. DIAGNOSIS Your health care provider will perform a physical exam and ask about your medical history.  TREATMENT Often, the best treatment for a hamstring strain is protecting, resting, icing, applying compression, and elevating the injured area. This is referred to as the PRICE method of treatment. Your health care provider may also recommend medicines to help reduce pain or inflammation. HOME CARE INSTRUCTIONS  Use the PRICE method of treatment to promote muscle healing during the first 2-3 days after your injury. The PRICE method  involves:  P--Protecting the muscle from being injured again.  R--Restricting your activity and resting the injured body part.  I--Icing your injury. To do this, put ice in a plastic bag. Place a towel between your skin and the bag. Then, apply the ice and leave it on for 20 minutes, 2-3 times per day. After the third day, switch to moist heat packs.  C--Applying compression to the injured area with an elastic bandage. Be careful not to wrap it too tightly. That may interfere with blood circulation or may increase swelling.  E--Elevating the injured body part above the level of your heart as often as you can. You can do this by putting a pillow under your thigh when you sit or lie down.  Take medicines only as directed by your health care provider.  Begin exercising or stretching as directed by your health care provider.  Do not return to full activity level until your health care provider approves.  Keep all follow-up visits as directed by your health care provider. This is important. SEEK MEDICAL CARE IF:  You have increasing pain or swelling in the injured area.  You have numbness, tingling, or a significant loss of strength in the injured area.  Your foot or your toes become cold or turn blue.   This information is not intended to replace advice given to you by your health care provider. Make sure you discuss any questions you have with your health care provider.   Document Released: 05/26/2001 Document Revised: 09/21/2014 Document Reviewed: 04/16/2014 Elsevier Interactive Patient Education 2016 Elsevier Inc.  Hamstring Strain With Rehab The hamstring muscle and tendons are vulnerable to muscle or tendon tear (strain). Hamstring tears  cause pain and inflammation in the backside of the thigh, where the hamstring muscles are located. The hamstring is comprised of three muscles that are responsible for straightening the hip, bending the knee, and stabilizing the knee. These muscles  are important for walking, running, and jumping. Hamstring strain is the most common injury of the thigh. Hamstring strains are classified as grade 1 or 2 strains. Grade 1 strains cause pain, but the tendon is not lengthened. Grade 2 strains include a lengthened ligament due to the ligament being stretched or partially ruptured. With grade 2 strains there is still function, although the function may be decreased.  SYMPTOMS   Pain, tenderness, swelling, warmth, or redness over the hamstring muscles, at the back of the thigh.  Pain that gets worse during and after intense activity.  A "pop" heard in the area, at the time of injury.  Muscle spasm in the hamstring muscles.  Pain or weakness with running, jumping, or bending the knee against resistance.  Crackling sound (crepitation) when the tendon is moved or touched.  Bruising (contusion) in the thigh within 48 hours of injury.  Loss of fullness of the muscle, or area of muscle bulging in the case of a complete rupture. CAUSES  A muscle strain occurs when a force is placed on the muscle or tendon that is greater than it can withstand. Common causes of injury include:  Strain from overuse or sudden increase in the frequency, duration, or intensity of activity.  Single violent blow or force to the back of the knee or the hamstring area of the thigh. RISK INCREASES WITH:  Sports that require quick starts (sprinting, racquetball, tennis).  Sports that require jumping (basketball, volleyball).  Kicking sports and water skiing.  Contact sports (soccer, football).  Poor strength and flexibility.  Failure to warm up properly before activity.  Previous thigh, knee, or pelvis injury.  Poor exercise technique.  Poor posture. PREVENTION  Maintain physical fitness:  Strength, flexibility, and endurance.  Cardiovascular fitness.  Learn and use proper exercise technique and posture.  Wear proper fitted and padded protective  equipment. PROGNOSIS  If treated properly, hamstring strains are usually curable in 2 to 6 weeks. RELATED COMPLICATIONS   Longer healing time, if not properly treated or if not given adequate time to heal.  Chronically inflamed tendon, causing persistent pain with activity that may progress to constant pain.  Recurring symptoms, if activity is resumed too soon.  Vulnerable to repeated injury (in up to 25% of cases). TREATMENT  Treatment first involves the use of ice and medication to help reduce pain and inflammation. It is also important to complete strengthening and stretching exercises, as well as modifying any activities that aggravate the symptoms. These exercises may be completed at home or with a therapist. Your caregiver may recommend the use of crutches to help reduce pain and discomfort, especially is the strain is severe enough to cause limping. If the tendon has pulled away from the bone, then surgery is necessary to reattach it. MEDICATION   If pain medicine is needed, nonsteroidal anti-inflammatory medicines (aspirin and ibuprofen), or other minor pain relievers (acetaminophen), are often advised.  Do not take pain medicine for 7 days before surgery.  Prescription pain relievers may be given if your caregiver thinks they are needed. Use only as directed and only as much as you need.  Corticosteroid injections may be recommended. However, these injections should only be used for serious cases, as they can only be given a  certain number of times.  Ointments applied to the skin may be beneficial. HEAT AND COLD  Cold treatment (icing) relieves pain and reduces inflammation. Cold treatment should be applied for 10 to 15 minutes every 2 to 3 hours, and immediately after activity that aggravates your symptoms. Use ice packs or an ice massage.  Heat treatment may be used before performing the stretching and strengthening activities prescribed by your caregiver, physical therapist,  or athletic trainer. Use a heat pack or a warm water soak. SEEK MEDICAL CARE IF:   Symptoms get worse or do not improve in 2 weeks, despite treatment.  New, unexplained symptoms develop. (Drugs used in treatment may produce side effects.) EXERCISES RANGE OF MOTION (ROM) AND STRETCHING EXERCISES - Hamstring Strain These exercises may help you when beginning to rehabilitate your injury. Your symptoms may go away with or without further involvement from your physician, physical therapist or athletic trainer. While completing these exercises, remember:   Restoring tissue flexibility helps normal motion to return to the joints. This allows healthier, less painful movement and activity.  An effective stretch should be held for at least 30 seconds.  A stretch should never be painful. You should only feel a gentle lengthening or release in the stretched tissue. STRETCH - Hamstrings, Standing  Stand or sit, and extend your right / left leg, placing your foot on a chair or foot stool.  Keep a slight arch in your low back and your hips straight forward.  Lead with your chest, and lean forward at the waist until you feel a gentle stretch in the back of your right / left knee or thigh. (When done correctly, this exercise requires leaning only a small distance.)  Hold this position for ____5-10______ seconds. Repeat ____5______ times. Complete this stretch _____1-2_____ times per day. STRETCH - Hamstrings, Supine   Lie on your back. Loop a belt or towel over the ball of your right / left foot.  Straighten your right / left knee and slowly pull on the belt to raise your leg. Do not allow the right / left knee to bend. Keep your opposite leg flat on the floor.  Raise the leg until you feel a gentle stretch behind your right / left knee or thigh. Hold this position for ____5-10______ seconds. Repeat ____5______ times. Complete this stretch _____1-2_____ times per day.  STRETCH - Hamstrings,  Doorway  Lie on your back with your right / left leg extended and resting on the wall, and the opposite leg flat on the ground through the door. Initially, position your bottom farther away from the wall.  Keep your right / left knee straight. If you feel a stretch behind your knee or thigh, hold this position for __________ seconds.  If you do not feel a stretch, scoot your bottom closer to the door and hold __________ seconds. Repeat __________ times. Complete this stretch __________ times per day.  STRETCH - Hamstrings/Adductors, V-Sit   Sit on the floor with your legs extended in a large "V," keeping your knees straight.  With your head and chest upright, bend at your waist reaching for your left foot to stretch your right thigh muscles.  You should feel a stretch in your right inner thigh. Hold for __________ seconds.  Return to the upright position to relax your leg muscles.  Continuing to keep your chest upright, bend straight forward at your waist to stretch your hamstrings.  You should feel a stretch behind both of your thighs and knees.  Hold for __________ seconds.  Return to the upright position to relax your leg muscles.  With your head and chest upright, bend at your waist reaching for your right foot to stretch your left thigh muscles.  You should feel a stretch in your left inner thigh. Hold for __________ seconds.  Return to the upright position to relax your leg muscles. Repeat __________ times. Complete this exercise __________ times per day.  STRENGTHENING EXERCISES - Hamstring Strain These exercises may help you when beginning to rehabilitate your injury. They may resolve your symptoms with or without further involvement from your physician, physical therapist or athletic trainer. While completing these exercises, remember:   Muscles can gain both the endurance and the strength needed for everyday activities through controlled exercises.  Complete these  exercises as instructed by your physician, physical therapist or athletic trainer. Increase the resistance and repetitions only as guided.  You may experience muscle soreness or fatigue, but the pain or discomfort you are trying to eliminate should never get worse during these exercises. If this pain does get worse, stop and make certain you are following the directions exactly. If the pain is still present after adjustments, discontinue the exercise until you can discuss the trouble with your clinician. STRENGTH - Hip Extensors, Straight Leg Raises   Lie on your stomach on a firm surface.  Tense the muscles in your buttocks to lift your right / left leg about 4 inches. If you cannot lift your leg this high without arching your back, place a pillow under your hips.  Keep your knee straight. Hold for __________ seconds.  Slowly lower your leg to the starting position and allow it to relax completely before starting the next repetition. Repeat __________ times. Complete this exercise __________ times per day.  STRENGTH - Hamstring, Isometrics   Lie on your back on a firm surface.  Bend your right / left knee approximately __________ degrees.  Dig your heel into the surface, as if you are trying to pull it toward your buttocks. Tighten the muscles in the back of your thighs to "dig" as hard as you can, without increasing any pain.  Hold this position for __________ seconds.  Release the tension gradually and allow your muscles to completely relax for __________ seconds between each exercise. Repeat __________ times. Complete this exercise __________ times per day.  STRENGTH - Hamstring, Curls   Lay on your stomach with your legs extended. (If you lay on a bed, your feet may hang over the edge.)  Tighten the muscles in the back of your thigh to bend your right / left knee up to 90 degrees. Keep your hips flat on the bed or floor.  Hold this position for __________ seconds.  Slowly lower  your leg back to the starting position. Repeat __________ times. Complete this exercise __________ times per day.  OPTIONAL ANKLE WEIGHTS: Begin with ____________________, but DO NOT exceed ____________________. Increase in 1 pound/0.5 kilogram increments.   This information is not intended to replace advice given to you by your health care provider. Make sure you discuss any questions you have with your health care provider.   Document Released: 08/31/2005 Document Revised: 09/21/2014 Document Reviewed: 12/13/2008 Elsevier Interactive Patient Education [employer]

## 2015-10-01 ENCOUNTER — Other Ambulatory Visit (INDEPENDENT_AMBULATORY_CARE_PROVIDER_SITE_OTHER): Payer: Self-pay | Admitting: Otolaryngology

## 2015-10-01 DIAGNOSIS — J329 Chronic sinusitis, unspecified: Secondary | ICD-10-CM

## 2015-10-07 ENCOUNTER — Ambulatory Visit
Admission: RE | Admit: 2015-10-07 | Discharge: 2015-10-07 | Disposition: A | Discharge status: Home / Self Care | Payer: 59 | Source: Ambulatory Visit | Attending: Otolaryngology | Admitting: Otolaryngology

## 2015-10-07 DIAGNOSIS — J329 Chronic sinusitis, unspecified: Secondary | ICD-10-CM

## 2016-06-24 ENCOUNTER — Ambulatory Visit (INDEPENDENT_AMBULATORY_CARE_PROVIDER_SITE_OTHER): Payer: 59 | Admitting: Physician Assistant

## 2016-06-24 VITALS — BP 140/86 | HR 87 | Temp 98.3°F | Resp 16 | Ht 66.0 in | Wt 231.2 lb

## 2016-06-24 DIAGNOSIS — J029 Acute pharyngitis, unspecified: Secondary | ICD-10-CM | POA: Diagnosis not present

## 2016-06-24 LAB — POCT RAPID STREP A (OFFICE): Rapid Strep A Screen: NEGATIVE

## 2016-06-24 NOTE — Patient Instructions (Addendum)
Thank you for coming in today. I hope you feel we met your needs.  Feel free to call [facility] if you have any questions or further requests.  Please consider signing up for MyChart if you do not already have it, as this is a great way to communicate with me.  Best,  Whitney McVey, PA-C  IF you received an x-ray today, you will receive an invoice from [facility]. Please contact [facility] at [phone] with questions or concerns regarding your invoice.   IF you received labwork today, you will receive an invoice from [employer]. Please contact [facility] at [phone] with questions or concerns regarding your invoice.   Our billing staff will not be able to assist you with questions regarding bills from these companies.  You will be contacted with the lab results as soon as they are available. The fastest way to get your results is to activate your My Chart account. Instructions are located on the last page of this paperwork. If you have not heard from [place] regarding the results in 2 weeks, please contact this office.     Pharyngitis Pharyngitis is redness, pain, and swelling (inflammation) of your pharynx.  CAUSES  Pharyngitis is usually caused by infection. Most of the time, these infections are from viruses (viral) and are part of a cold. However, sometimes pharyngitis is caused by bacteria (bacterial). Pharyngitis can also be caused by allergies. Viral pharyngitis may be spread from person to person by coughing, sneezing, and personal items or utensils (cups, forks, spoons, toothbrushes). Bacterial pharyngitis may be spread from person to person by more intimate contact, such as kissing.  SIGNS AND SYMPTOMS  Symptoms of pharyngitis include:   Sore throat.   Tiredness (fatigue).   Low-grade fever.   Headache.  Joint pain and muscle aches.  Skin rashes.  Swollen lymph nodes.  Plaque-like film on throat or tonsils (often seen  with bacterial pharyngitis). DIAGNOSIS  Your health care provider will ask you questions about your illness and your symptoms. Your medical history, along with a physical exam, is often all that is needed to diagnose pharyngitis. Sometimes, a rapid strep test is done. Other lab tests may also be done, depending on the suspected cause.  TREATMENT  Viral pharyngitis will usually get better in 3-4 days without the use of medicine. Bacterial pharyngitis is treated with medicines that kill germs (antibiotics).  HOME CARE INSTRUCTIONS   Drink enough water and fluids to keep your urine clear or pale yellow.   Only take over-the-counter or prescription medicines as directed by your health care provider:   If you are prescribed antibiotics, make sure you finish them even if you start to feel better.   Do not take aspirin.   Get lots of rest.   Gargle with 8 oz of salt water ( tsp of salt per 1 qt of water) as often as every 1-2 hours to soothe your throat.   Throat lozenges (if you are not at risk for choking) or sprays may be used to soothe your throat. SEEK MEDICAL CARE IF:   You have large, tender lumps in your neck.  You have a rash.  You cough up green, yellow-brown, or bloody spit. SEEK IMMEDIATE MEDICAL CARE IF:   Your neck becomes stiff.  You drool or are unable to swallow liquids.  You vomit or are unable to keep medicines or liquids down.  You have severe pain that does not go away with the use  of recommended medicines.  You have trouble breathing (not caused by a stuffy nose). MAKE SURE YOU:   Understand these instructions.  Will watch your condition.  Will get help right away if you are not doing well or get worse.   This information is not intended to replace advice given to you by your health care provider. Make sure you discuss any questions you have with your health care provider.   Document Released: 08/31/2005 Document Revised: 06/21/2013 Document  Reviewed: 05/08/2013 Elsevier Interactive Patient Education [employer].

## 2016-06-24 NOTE — Progress Notes (Signed)
Janet Watson  MRN: [id number] DOB: November 15, 1960  PCP: Londell Moh, MD  Subjective:  Pt is a 55 year old female presents to clinic for sore throat x 5 days.  Five days ago she had a bad sore throat, headache, 102 temp, "felt like crap", body aches. Her symptoms went away in less than 24 hours. The headache persisted, but she thought she was better, no fever. She complains of irritated throat. 3/10 pain.  She is woried she has strep. Swollen glands in throat.   Has tried Aspirin and Tylenol. Helped, but wore off.   Review of Systems  Constitutional: Positive for fatigue and fever. Negative for chills.  HENT: Positive for sore throat and voice change. Negative for congestion, postnasal drip, rhinorrhea, sinus pressure and trouble swallowing.   Respiratory: Negative for cough, chest tightness, shortness of breath and wheezing.   Cardiovascular: Negative for chest pain.  Gastrointestinal: Negative for diarrhea, nausea and vomiting.  Skin: Negative.   Neurological: Positive for headaches.    Patient Active Problem List   Diagnosis Date Noted  . Benign essential HTN 07/19/2015  . Hypothyroidism 07/19/2015  . ADD (attention deficit disorder) 07/19/2015  . Plantar fasciitis 07/19/2015    Current Outpatient Prescriptions on File Prior to Visit  Medication Sig Dispense Refill  . COD LIVER OIL PO Take by mouth.    . etodolac (LODINE) 500 MG tablet Take 500 mg by mouth 2 (two) times daily as needed.    . hyoscyamine (LEVSIN, ANASPAZ) 0.125 MG tablet Take 0.125 mg by mouth every 4 (four) hours as needed.    . irbesartan-hydrochlorothiazide (AVALIDE) 150-12.5 MG tablet Take 1 tablet by mouth daily.    Marland Kitchen levothyroxine (SYNTHROID, LEVOTHROID) 50 MCG tablet Take 50 mcg by mouth daily before breakfast.    . lisdexamfetamine (VYVANSE) 40 MG capsule Take 40 mg by mouth every morning.    . TURMERIC PO Take by mouth.    . vitamin C (ASCORBIC ACID) 250 MG tablet Take 250 mg by mouth  daily.     No current facility-administered medications on file prior to visit.     Allergies  Allergen Reactions  . Erythromycin Nausea And Vomiting    Objective:  BP 140/86 (BP Location: Right Arm, Patient Position: Sitting, Cuff Size: Normal)   Pulse 87   Temp 98.3 F (36.8 C) (Oral)   Resp 16   Ht 5\' 6"  (1.676 m)   Wt 231 lb 3.2 oz (104.9 kg)   SpO2 97%   BMI 37.32 kg/m   Physical Exam  Constitutional: She is oriented to person, place, and time and well-developed, well-nourished, and in no distress. She appears not dehydrated. She does not have a sickly appearance. No distress.  HENT:  Right Ear: Tympanic membrane and ear canal normal.  Left Ear: Tympanic membrane and ear canal normal.  Mouth/Throat: Uvula is midline and mucous membranes are normal. Posterior oropharyngeal erythema present. No oropharyngeal exudate, posterior oropharyngeal edema or tonsillar abscesses.  Eyes: Conjunctivae are normal.  Neck: Normal range of motion. Neck supple.  Cardiovascular: Normal rate, regular rhythm and normal heart sounds.   Pulmonary/Chest: Effort normal. No respiratory distress.  Lymphadenopathy:    She has no cervical adenopathy.  Neurological: She is alert and oriented to person, place, and time. GCS score is 15.  Skin: Skin is warm and dry.  Psychiatric: Mood, memory, affect and judgment normal.  Vitals reviewed.   Results for orders placed or performed in visit on 06/24/16  POCT rapid strep  A  Result Value Ref Range   Rapid Strep A Screen Negative Negative    Assessment and Plan :  1. Sore throat 2. Acute pharyngitis, unspecified etiology - Culture, Group A Strep - POCT rapid strep A - Supportive care encouraged: Drink plenty of fluids, gargle warm salt water and rest. RTC if no improvement or worsening symptoms in 7-10 days.     Marco CollieWhitney Angie Piercey, PA-C  [facility] 06/24/2016 3:07 PM

## 2016-06-26 ENCOUNTER — Encounter: Payer: Self-pay | Admitting: Physician Assistant

## 2016-06-26 LAB — CULTURE, GROUP A STREP: Organism ID, Bacteria: NORMAL

## 2016-06-26 NOTE — Progress Notes (Signed)
Please let this patient know her strep culture is negative. Continue therapy as discussed at the office.

## 2016-10-09 DIAGNOSIS — Z1321 Encounter for screening for nutritional disorder: Secondary | ICD-10-CM | POA: Diagnosis not present

## 2016-10-09 DIAGNOSIS — Z0000 Encounter for general adult medical examination without abnormal findings: Secondary | ICD-10-CM | POA: Diagnosis not present

## 2016-10-27 DIAGNOSIS — I10 Essential (primary) hypertension: Secondary | ICD-10-CM | POA: Diagnosis not present

## 2016-10-27 DIAGNOSIS — Z0001 Encounter for general adult medical examination with abnormal findings: Secondary | ICD-10-CM | POA: Diagnosis not present

## 2017-01-26 DIAGNOSIS — I10 Essential (primary) hypertension: Secondary | ICD-10-CM | POA: Diagnosis not present

## 2017-04-28 DIAGNOSIS — R5381 Other malaise: Secondary | ICD-10-CM | POA: Diagnosis not present

## 2017-04-28 DIAGNOSIS — R1032 Left lower quadrant pain: Secondary | ICD-10-CM | POA: Diagnosis not present

## 2017-05-10 DIAGNOSIS — R300 Dysuria: Secondary | ICD-10-CM | POA: Diagnosis not present

## 2017-05-10 DIAGNOSIS — R1032 Left lower quadrant pain: Secondary | ICD-10-CM | POA: Diagnosis not present

## 2017-07-29 DIAGNOSIS — Z23 Encounter for immunization: Secondary | ICD-10-CM | POA: Diagnosis not present

## 2017-10-28 DIAGNOSIS — N951 Menopausal and female climacteric states: Secondary | ICD-10-CM | POA: Diagnosis not present

## 2017-10-28 DIAGNOSIS — Z01419 Encounter for gynecological examination (general) (routine) without abnormal findings: Secondary | ICD-10-CM | POA: Diagnosis not present

## 2017-11-01 DIAGNOSIS — Z0000 Encounter for general adult medical examination without abnormal findings: Secondary | ICD-10-CM | POA: Diagnosis not present

## 2017-11-01 DIAGNOSIS — I10 Essential (primary) hypertension: Secondary | ICD-10-CM | POA: Diagnosis not present

## 2017-11-01 DIAGNOSIS — E039 Hypothyroidism, unspecified: Secondary | ICD-10-CM | POA: Diagnosis not present

## 2017-11-05 DIAGNOSIS — Z0000 Encounter for general adult medical examination without abnormal findings: Secondary | ICD-10-CM | POA: Diagnosis not present

## 2017-11-05 DIAGNOSIS — I10 Essential (primary) hypertension: Secondary | ICD-10-CM | POA: Diagnosis not present

## 2017-11-05 DIAGNOSIS — L719 Rosacea, unspecified: Secondary | ICD-10-CM | POA: Diagnosis not present

## 2018-01-21 ENCOUNTER — Ambulatory Visit (INDEPENDENT_AMBULATORY_CARE_PROVIDER_SITE_OTHER): Payer: Self-pay

## 2018-01-21 ENCOUNTER — Ambulatory Visit (INDEPENDENT_AMBULATORY_CARE_PROVIDER_SITE_OTHER): Payer: 59 | Admitting: Orthopedic Surgery

## 2018-01-21 ENCOUNTER — Encounter (INDEPENDENT_AMBULATORY_CARE_PROVIDER_SITE_OTHER): Payer: Self-pay | Admitting: Orthopedic Surgery

## 2018-01-21 DIAGNOSIS — M25572 Pain in left ankle and joints of left foot: Secondary | ICD-10-CM | POA: Diagnosis not present

## 2018-01-21 DIAGNOSIS — M25561 Pain in right knee: Secondary | ICD-10-CM | POA: Diagnosis not present

## 2018-01-21 NOTE — Progress Notes (Signed)
Office Visit Note   Patient: Janet Watson           Date of Birth: [birth year]           MRN: [id number] Visit Date: 01/21/2018 Requested by: Merri Brunette, MD [address] [place], [place] [address] PCP: Merri Brunette, MD  Subjective: Chief Complaint  Patient presents with  . Left Ankle - Pain  . 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  She has had left knee surgery in 2013 and well with that.  She had some type of arthroscopy procedure in 1999.  She states she has had pain for several weeks but it is getting a little worse.  Does not wake her from sleep.  She reports swelling and locking as well as weakness and giving way and popping.  She has a brace which does not fit anymore but used to control her knee locking.  She also reports left ankle pain of 6 months duration with no known history of injury.  This will occasionally wake her from sleep at night.  It loosens up at times.  She is not taking any medication for this problem.              ROS: All systems reviewed are negative as they relate to the chief complaint within the history of present illness.  Patient denies  fevers or chills.   Assessment & Plan: Visit Diagnoses:  1. Right knee pain, unspecified chronicity   2. 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  Radiographs of the ankle are unremarkable.  I think that something that should be a self-limited problem.  I will see her back as needed for the left ankle.  Next intervention would be injection into the calcaneocuboid joint for diagnostic and therapeutic purposes.  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  Avoid loadbearing exercises.  Her ACL feels a little deficient so I  do not think unicompartmental knee replacement is a good option for her.  Injection performed today.  I will see her back as needed.  Follow-Up Instructions: Return if symptoms worsen or fail to improve.   Orders:  Orders Placed This Encounter  Procedures  . XR Ankle Complete Left  . XR KNEE 3 VIEW RIGHT  . XR Foot Complete Left   No orders of the defined types were placed in this encounter.     Procedures: No procedures performed   Clinical Data: No additional findings.  Objective: Vital Signs: There were no vitals taken for this visit.  Physical Exam:   Constitutional: Patient appears well-developed HEENT:  Head: Normocephalic Eyes:EOM are normal Neck: Normal range of motion Cardiovascular: Normal rate Pulmonary/chest: Effort normal Neurologic: Patient is alert Skin: Skin is warm Psychiatric: Patient has normal mood and affect  Orthopedic exam demonstrates full active and passive range of motion of the left knee.  On the right knee she has about a 7 degree flexion contracture with no effusion.  Ortho Exam: ACL feels a little loose on the right compared to the left.  Collaterals are stable.  There is medial joint line tenderness and fairly diminished medial and lateral patellar mobility bilaterally.  No groin pain with internal  and external rotation of either leg.  Pedal pulses palpable.  Specialty Comments:  No specialty comments available.  Imaging: Xr Ankle Complete Left  Result Date: 01/21/2018 AP lateral mortise left ankle reviewed.  Mortise is symmetric.  No fracture or dislocation present.  Normal left ankle  Xr Foot Complete Left  Result Date: 01/21/2018 AP lateral oblique left foot reviewed.  There are degenerative changes noted with spurring at the calcaneocuboid joint.  No other fracture or dislocation is visualized.  Tarsometatarsal alignment intact.  Xr Knee 3 View Right  Result Date: 01/21/2018 AP lateral merchant right knee reviewed.  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  Bone-on-bone changes present in these 2 compartments.  No loose bodies present.  Mild patella baja present.    PMFS History: Patient Active Problem List   Diagnosis Date Noted  . Benign essential HTN 07/19/2015  . Hypothyroidism 07/19/2015  . ADD (attention deficit disorder) 07/19/2015  . Plantar fasciitis 07/19/2015   Past Medical History:  Diagnosis Date  . ADHD (attention deficit hyperactivity disorder)   . Arthritis   . Hypertension     Family History  Problem Relation Age of Onset  . Heart disease Mother   . Hyperlipidemia Mother   . Hypertension Mother   . Mental illness Mother   . Hypertension Father   . Heart disease Maternal Uncle     Past Surgical History:  Procedure Laterality Date  . CARPAL TUNNEL RELEASE Bilateral   . KNEE SURGERY Bilateral    Social History   Occupational History  . Occupation: CSI    Comment: [employer]  Tobacco Use  . Smoking status: Never Smoker  . Smokeless tobacco: Never Used  Substance and Sexual Activity  . Alcohol use: Yes    Alcohol/week: 0.0 - 2.4 oz    Comment: most often, doesn't drink  . Drug use: No  . Sexual activity: Not on file

## 2018-01-27 ENCOUNTER — Telehealth (INDEPENDENT_AMBULATORY_CARE_PROVIDER_SITE_OTHER): Payer: Self-pay | Admitting: Orthopedic Surgery

## 2018-01-27 DIAGNOSIS — E039 Hypothyroidism, unspecified: Secondary | ICD-10-CM | POA: Diagnosis not present

## 2018-01-27 NOTE — Telephone Encounter (Signed)
Patient is requesting X-ray disc of right knee/left ankle/left foot all on one disc. She is coming to fill out medical release form.

## 2018-01-27 NOTE — Telephone Encounter (Signed)
Disc made and put up front. Patient advised of $5 fee

## 2018-02-08 DIAGNOSIS — M25561 Pain in right knee: Secondary | ICD-10-CM | POA: Insufficient documentation

## 2018-02-15 DIAGNOSIS — M25561 Pain in right knee: Secondary | ICD-10-CM | POA: Diagnosis not present

## 2018-02-22 DIAGNOSIS — M25561 Pain in right knee: Secondary | ICD-10-CM | POA: Diagnosis not present

## 2018-03-04 DIAGNOSIS — E039 Hypothyroidism, unspecified: Secondary | ICD-10-CM | POA: Diagnosis not present

## 2018-04-06 DIAGNOSIS — S83511A Sprain of anterior cruciate ligament of right knee, initial encounter: Secondary | ICD-10-CM | POA: Diagnosis not present

## 2018-04-06 DIAGNOSIS — M1711 Unilateral primary osteoarthritis, right knee: Secondary | ICD-10-CM | POA: Diagnosis not present

## 2018-07-01 DIAGNOSIS — Z23 Encounter for immunization: Secondary | ICD-10-CM | POA: Diagnosis not present

## 2018-08-22 DIAGNOSIS — E039 Hypothyroidism, unspecified: Secondary | ICD-10-CM | POA: Diagnosis not present

## 2018-11-07 DIAGNOSIS — E039 Hypothyroidism, unspecified: Secondary | ICD-10-CM | POA: Diagnosis not present

## 2018-11-07 DIAGNOSIS — Z0000 Encounter for general adult medical examination without abnormal findings: Secondary | ICD-10-CM | POA: Diagnosis not present

## 2018-11-07 DIAGNOSIS — Z1159 Encounter for screening for other viral diseases: Secondary | ICD-10-CM | POA: Diagnosis not present

## 2018-11-10 ENCOUNTER — Emergency Department (HOSPITAL_COMMUNITY): Payer: 59

## 2018-11-10 ENCOUNTER — Encounter (HOSPITAL_COMMUNITY): Payer: Self-pay | Admitting: Emergency Medicine

## 2018-11-10 ENCOUNTER — Emergency Department (HOSPITAL_COMMUNITY)
Admission: EM | Admit: 2018-11-10 | Discharge: 2018-11-10 | Disposition: A | Discharge status: Home / Self Care | Payer: 59 | Attending: Emergency Medicine | Admitting: Emergency Medicine

## 2018-11-10 ENCOUNTER — Other Ambulatory Visit: Payer: Self-pay

## 2018-11-10 DIAGNOSIS — F909 Attention-deficit hyperactivity disorder, unspecified type: Secondary | ICD-10-CM | POA: Insufficient documentation

## 2018-11-10 DIAGNOSIS — M25511 Pain in right shoulder: Secondary | ICD-10-CM | POA: Diagnosis not present

## 2018-11-10 DIAGNOSIS — R1011 Right upper quadrant pain: Secondary | ICD-10-CM | POA: Insufficient documentation

## 2018-11-10 DIAGNOSIS — K8050 Calculus of bile duct without cholangitis or cholecystitis without obstruction: Secondary | ICD-10-CM

## 2018-11-10 DIAGNOSIS — K7689 Other specified diseases of liver: Secondary | ICD-10-CM | POA: Diagnosis not present

## 2018-11-10 DIAGNOSIS — K8020 Calculus of gallbladder without cholecystitis without obstruction: Secondary | ICD-10-CM | POA: Diagnosis not present

## 2018-11-10 DIAGNOSIS — I10 Essential (primary) hypertension: Secondary | ICD-10-CM | POA: Insufficient documentation

## 2018-11-10 LAB — CBC
HCT: 42.7 % (ref 36.0–46.0)
HEMOGLOBIN: 14 g/dL (ref 12.0–15.0)
MCH: 31.7 pg (ref 26.0–34.0)
MCHC: 32.8 g/dL (ref 30.0–36.0)
MCV: 96.6 fL (ref 80.0–100.0)
Platelets: 217 10*3/uL (ref 150–400)
RBC: 4.42 MIL/uL (ref 3.87–5.11)
RDW: 12.8 % (ref 11.5–15.5)
WBC: 9 10*3/uL (ref 4.0–10.5)
nRBC: 0 % (ref 0.0–0.2)

## 2018-11-10 LAB — COMPREHENSIVE METABOLIC PANEL
ALBUMIN: 4 g/dL (ref 3.5–5.0)
ALT: 18 U/L (ref 0–44)
AST: 17 U/L (ref 15–41)
Alkaline Phosphatase: 68 U/L (ref 38–126)
Anion gap: 9 (ref 5–15)
BUN: 12 mg/dL (ref 6–20)
CHLORIDE: 104 mmol/L (ref 98–111)
CO2: 25 mmol/L (ref 22–32)
Calcium: 8.9 mg/dL (ref 8.9–10.3)
Creatinine, Ser: 0.64 mg/dL (ref 0.44–1.00)
GFR calc Af Amer: >60 mL/min (ref >60)
GFR calc non Af Amer: >60 mL/min (ref >60)
GLUCOSE: 100 mg/dL — AB (ref 70–99)
Potassium: 3.8 mmol/L (ref 3.5–5.1)
SODIUM: 138 mmol/L (ref 135–145)
Total Bilirubin: 0.9 mg/dL (ref 0.3–1.2)
Total Protein: 7.4 g/dL (ref 6.5–8.1)

## 2018-11-10 LAB — URINALYSIS, ROUTINE W REFLEX MICROSCOPIC
Bilirubin Urine: NEGATIVE
Glucose, UA: NEGATIVE mg/dL
Hgb urine dipstick: NEGATIVE
Ketones, ur: NEGATIVE mg/dL
Leukocytes,Ua: NEGATIVE
Nitrite: NEGATIVE
PH: 6 (ref 5.0–8.0)
Protein, ur: NEGATIVE mg/dL
SPECIFIC GRAVITY, URINE: 1.005 (ref 1.005–1.030)

## 2018-11-10 LAB — LIPASE, BLOOD: LIPASE: 33 U/L (ref 11–51)

## 2018-11-10 LAB — I-STAT BETA HCG BLOOD, ED (MC, WL, AP ONLY): I-stat hCG, quantitative: <5 m[IU]/mL (ref <5)

## 2018-11-10 MED ORDER — SODIUM CHLORIDE 0.9% FLUSH: 3.0000 mL | Freq: Once | INTRAVENOUS | Status: DC

## 2018-11-10 MED ORDER — DICYCLOMINE HCL 20 MG PO TABS: 20.0000 mg | ORAL_TABLET | Freq: Two times a day (BID) | ORAL | 0 refills | Status: AC

## 2018-11-10 MED ORDER — ONDANSETRON 4 MG PO TBDP: 4.0000 mg | ORAL_TABLET | Freq: Three times a day (TID) | ORAL | 0 refills | Status: AC | PRN

## 2018-11-10 MED ORDER — ALUM & MAG HYDROXIDE-SIMETH 200-200-20 MG/5ML PO SUSP
30.0000 mL | Freq: Once | ORAL | Status: AC
  Administered 2018-11-10: 30 mL via ORAL
  Filled 2018-11-10: qty 30

## 2018-11-10 MED ORDER — ONDANSETRON 4 MG PO TBDP
4.0000 mg | ORAL_TABLET | Freq: Once | ORAL | Status: AC
  Administered 2018-11-10: 4 mg via ORAL
  Filled 2018-11-10: qty 1

## 2018-11-10 MED ORDER — OXYCODONE HCL 5 MG PO TABS: 5.0000 mg | ORAL_TABLET | ORAL | 0 refills | Status: DC | PRN

## 2018-11-10 MED ORDER — LIDOCAINE VISCOUS HCL 2 % MT SOLN
15.0000 mL | Freq: Once | OROMUCOSAL | Status: AC
  Administered 2018-11-10: 15 mL via ORAL
  Filled 2018-11-10: qty 15

## 2018-11-10 MED ORDER — OXYCODONE HCL 5 MG PO TABS
5.0000 mg | ORAL_TABLET | Freq: Once | ORAL | Status: AC
  Administered 2018-11-10: 5 mg via ORAL
  Filled 2018-11-10: qty 1

## 2018-11-10 NOTE — ED Notes (Signed)
Ultrasound at bedside

## 2018-11-10 NOTE — ED Notes (Signed)
Bed: DC30 Expected date:  Expected time:  Means of arrival:  Comments: closing

## 2018-11-10 NOTE — ED Provider Notes (Signed)
[facility] Emergency Department Provider Note MRN:  [id number]  Arrival date & time: 11/10/18     Chief Complaint   Abdominal Pain   History of Present Illness   Janet Watson is a 58 y.o. year-old female with a history of hypertension presenting to the ED with chief complaint of abdominal pain.  The pain is present in the right upper quadrant, radiating to the right back and right shoulder.  Symptoms are intermittent.  Patient had a episode of this pain 5 days ago that lasted for a few hours and then resolved.  Patient's current pain episode began yesterday evening after dinner, has not gone away.  Moderate in severity, described as a sharp pain.  Worse with meals.  Denies fever, no chest pain or shortness of breath, no lower abdominal pain, no vaginal bleeding or discharge.  Review of Systems  A complete 10 system review of systems was obtained and all systems are negative except as noted in the HPI and PMH.   Patient's Health History    Past Medical History:  Diagnosis Date  . ADHD (attention deficit hyperactivity disorder)   . Arthritis   . Hypertension     Past Surgical History:  Procedure Laterality Date  . CARPAL TUNNEL RELEASE Bilateral   . KNEE SURGERY Bilateral     Family History  Problem Relation Age of Onset  . Heart disease Mother   . Hyperlipidemia Mother   . Hypertension Mother   . Mental illness Mother   . Hypertension Father   . Heart disease Maternal Uncle     Social History   Socioeconomic History  . Marital status: Divorced    Spouse name: n/a  . Number of children: 0  . Years of education: College  . Highest education level: Not on file  Occupational History  . Occupation: CSI    Comment: [employer]  Social Needs  . Financial resource strain: Not on file  . Food insecurity:    Worry: Not on file    Inability: Not on file  . Transportation needs:    Medical: Not on file    Non-medical: Not on  file  Tobacco Use  . Smoking status: Never Smoker  . Smokeless tobacco: Never Used  Substance and Sexual Activity  . Alcohol use: Yes    Alcohol/week: 0.0 - 4.0 standard drinks    Comment: most often, doesn't drink  . Drug use: No  . Sexual activity: Not on file  Lifestyle  . Physical activity:    Days per week: Not on file    Minutes per session: Not on file  . Stress: Not on file  Relationships  . Social connections:    Talks on phone: Not on file    Gets together: Not on file    Attends religious service: Not on file    Active member of club or organization: Not on file    Attends meetings of clubs or organizations: Not on file    Relationship status: Not on file  . Intimate partner violence:    Fear of current or ex partner: Not on file    Emotionally abused: Not on file    Physically abused: Not on file    Forced sexual activity: Not on file  Other Topics Concern  . Not on file  Social History Narrative   Lives alone.     Physical Exam  Vital Signs and Nursing Notes reviewed Vitals:   11/10/18 0808 11/10/18  0907  BP: (!) 154/89 132/80  Pulse: 92 86  Resp: 15 15  Temp:    SpO2: 96% 94%    CONSTITUTIONAL: Well-appearing, NAD NEURO:  Alert and oriented x 3, no focal deficits EYES:  eyes equal and reactive ENT/NECK:  no LAD, no JVD CARDIO: Regular rate, well-perfused, normal S1 and S2 PULM:  CTAB no wheezing or rhonchi GI/GU:  normal bowel sounds, non-distended, moderate tenderness palpation to the right upper quadrant MSK/SPINE:  No gross deformities, no edema SKIN:  no rash, atraumatic PSYCH:  Appropriate speech and behavior  Diagnostic and Interventional Summary    Labs Reviewed  COMPREHENSIVE METABOLIC PANEL - Abnormal; Notable for the following components:      Result Value   Glucose, Bld 100 (*)    All other components within normal limits  LIPASE, BLOOD  CBC  URINALYSIS, ROUTINE W REFLEX MICROSCOPIC  I-STAT BETA HCG BLOOD, ED ([facility], [facility], [facility] ONLY)     US Abdomen Limited RUQ  Final Result      Medications  sodium chloride flush (NS) 0.9 % injection 3 mL (has no administration in time range)  alum & mag hydroxide-simeth (MAALOX/MYLANTA) 200-200-20 MG/5ML suspension 30 mL (30 mLs Oral Given 11/10/18 0805)    And  lidocaine (XYLOCAINE) 2 % viscous mouth solution 15 mL (15 mLs Oral Given 11/10/18 0805)  oxyCODONE (Oxy IR/ROXICODONE) immediate release tablet 5 mg (5 mg Oral Given 11/10/18 0805)  ondansetron (ZOFRAN-ODT) disintegrating tablet 4 mg (4 mg Oral Given 11/10/18 0805)     Procedures Critical Care  ED Course and Medical Decision Making  I have reviewed the triage vital signs and the nursing notes.  Pertinent labs & imaging results that were available during my care of the patient were reviewed by me and considered in my medical decision making (see below for details).  Suspect biliary colic in this 58 year old female, will ultrasound to confirm and exclude acute cholecystitis.  Also considering peptic ulcer disease, will trial GI cocktail, labs unremarkable.  Ultrasound consistent with cholelithiasis without cholecystitis, consistent with clinical picture of biliary colic.  Patient is tolerating p.o. and feeling well enough for discharge.  Refer to general surgery, provided with pain control options at home.  Elmer Sow. Pilar Plate, MD [facility] Emergency Medicine [facility] [email or web address]  Final Clinical Impressions(s) / ED Diagnoses     ICD-10-CM   1. Biliary colic K80.50   2. RUQ pain R10.11 US Abdomen Limited RUQ    US Abdomen Limited RUQ    ED Discharge Orders         Ordered    ondansetron (ZOFRAN ODT) 4 MG disintegrating tablet  Every 8 hours PRN     11/10/18 0942    oxyCODONE (ROXICODONE) 5 MG immediate release tablet  Every 4 hours PRN     11/10/18 0942    dicyclomine (BENTYL) 20 MG tablet  2 times daily     11/10/18 0942             Sabas Sous, MD 11/10/18 [phone]

## 2018-11-10 NOTE — ED Triage Notes (Signed)
Patient is complaining of right upper abdominal pain that radiates to her back. Patient states this started last night and states it has gotten worse. Patient is nauseas also. One episode of diarrhea this am.

## 2018-11-10 NOTE — Discharge Instructions (Addendum)
You were evaluated in the Emergency Department and after careful evaluation, we did not find any emergent condition requiring admission or further testing in the hospital.  Your symptoms today seem to be due to pain related to stones in the gallbladder.  Please call the office of the general surgeon provided to discuss the need for future surgery.  We recommend using Tylenol during the day every 4-6 hours for pain, as well as the Bentyl medication provided.  We have also provided oxycodone as a stronger option as well as Zofran for nausea.  Please return to the Emergency Department if you experience any worsening of your condition.  We encourage you to follow up with a primary care provider.  Thank you for allowing [place] to be a part of your care.

## 2018-11-11 ENCOUNTER — Observation Stay (HOSPITAL_COMMUNITY): Payer: 59

## 2018-11-11 ENCOUNTER — Other Ambulatory Visit: Payer: Self-pay

## 2018-11-11 ENCOUNTER — Encounter (HOSPITAL_COMMUNITY): Payer: Self-pay

## 2018-11-11 ENCOUNTER — Emergency Department (HOSPITAL_COMMUNITY): Payer: 59 | Admitting: Anesthesiology

## 2018-11-11 ENCOUNTER — Encounter (HOSPITAL_COMMUNITY)
Admission: EM | Disposition: A | Discharge status: Home / Self Care | Payer: Self-pay | Source: Home / Self Care | Attending: Emergency Medicine

## 2018-11-11 ENCOUNTER — Observation Stay (HOSPITAL_COMMUNITY)
Admission: EM | Admit: 2018-11-11 | Discharge: 2018-11-12 | Disposition: A | Discharge status: Home / Self Care | Payer: 59 | Attending: General Surgery | Admitting: General Surgery

## 2018-11-11 DIAGNOSIS — I10 Essential (primary) hypertension: Secondary | ICD-10-CM | POA: Diagnosis not present

## 2018-11-11 DIAGNOSIS — K7689 Other specified diseases of liver: Secondary | ICD-10-CM | POA: Insufficient documentation

## 2018-11-11 DIAGNOSIS — K8000 Calculus of gallbladder with acute cholecystitis without obstruction: Secondary | ICD-10-CM | POA: Diagnosis present

## 2018-11-11 DIAGNOSIS — K8010 Calculus of gallbladder with chronic cholecystitis without obstruction: Secondary | ICD-10-CM | POA: Diagnosis not present

## 2018-11-11 DIAGNOSIS — K8050 Calculus of bile duct without cholangitis or cholecystitis without obstruction: Secondary | ICD-10-CM

## 2018-11-11 DIAGNOSIS — K82A1 Gangrene of gallbladder in cholecystitis: Secondary | ICD-10-CM | POA: Insufficient documentation

## 2018-11-11 DIAGNOSIS — Z0001 Encounter for general adult medical examination with abnormal findings: Secondary | ICD-10-CM | POA: Diagnosis not present

## 2018-11-11 DIAGNOSIS — E039 Hypothyroidism, unspecified: Secondary | ICD-10-CM | POA: Diagnosis not present

## 2018-11-11 DIAGNOSIS — Z793 Long term (current) use of hormonal contraceptives: Secondary | ICD-10-CM | POA: Diagnosis not present

## 2018-11-11 DIAGNOSIS — Z79899 Other long term (current) drug therapy: Secondary | ICD-10-CM | POA: Diagnosis not present

## 2018-11-11 DIAGNOSIS — Z79890 Hormone replacement therapy (postmenopausal): Secondary | ICD-10-CM | POA: Diagnosis not present

## 2018-11-11 DIAGNOSIS — Z975 Presence of (intrauterine) contraceptive device: Secondary | ICD-10-CM | POA: Diagnosis not present

## 2018-11-11 DIAGNOSIS — F909 Attention-deficit hyperactivity disorder, unspecified type: Secondary | ICD-10-CM | POA: Insufficient documentation

## 2018-11-11 DIAGNOSIS — K810 Acute cholecystitis: Secondary | ICD-10-CM | POA: Diagnosis not present

## 2018-11-11 DIAGNOSIS — K819 Cholecystitis, unspecified: Secondary | ICD-10-CM | POA: Diagnosis present

## 2018-11-11 DIAGNOSIS — K219 Gastro-esophageal reflux disease without esophagitis: Secondary | ICD-10-CM | POA: Diagnosis not present

## 2018-11-11 DIAGNOSIS — J309 Allergic rhinitis, unspecified: Secondary | ICD-10-CM | POA: Diagnosis not present

## 2018-11-11 DIAGNOSIS — Z419 Encounter for procedure for purposes other than remedying health state, unspecified: Secondary | ICD-10-CM

## 2018-11-11 HISTORY — PX: CHOLECYSTECTOMY: SHX55

## 2018-11-11 LAB — COMPREHENSIVE METABOLIC PANEL
ALT: 44 U/L (ref 0–44)
AST: 47 U/L — ABNORMAL HIGH (ref 15–41)
Albumin: 4.1 g/dL (ref 3.5–5.0)
Alkaline Phosphatase: 86 U/L (ref 38–126)
Anion gap: 12 (ref 5–15)
BUN: 11 mg/dL (ref 6–20)
CO2: 26 mmol/L (ref 22–32)
Calcium: 9.3 mg/dL (ref 8.9–10.3)
Chloride: 98 mmol/L (ref 98–111)
Creatinine, Ser: 0.95 mg/dL (ref 0.44–1.00)
GFR calc non Af Amer: >60 mL/min (ref >60)
Glucose, Bld: 108 mg/dL — ABNORMAL HIGH (ref 70–99)
Potassium: 4.2 mmol/L (ref 3.5–5.1)
Sodium: 136 mmol/L (ref 135–145)
Total Bilirubin: 2.3 mg/dL — ABNORMAL HIGH (ref 0.3–1.2)
Total Protein: 8.2 g/dL — ABNORMAL HIGH (ref 6.5–8.1)

## 2018-11-11 LAB — LIPASE, BLOOD: Lipase: 33 U/L (ref 11–51)

## 2018-11-11 LAB — CBC
HCT: 47.3 % — ABNORMAL HIGH (ref 36.0–46.0)
Hemoglobin: 15.1 g/dL — ABNORMAL HIGH (ref 12.0–15.0)
MCH: 31.1 pg (ref 26.0–34.0)
MCHC: 31.9 g/dL (ref 30.0–36.0)
MCV: 97.5 fL (ref 80.0–100.0)
PLATELETS: 267 10*3/uL (ref 150–400)
RBC: 4.85 MIL/uL (ref 3.87–5.11)
RDW: 12.5 % (ref 11.5–15.5)
WBC: 11.8 10*3/uL — ABNORMAL HIGH (ref 4.0–10.5)
nRBC: 0 % (ref 0.0–0.2)

## 2018-11-11 SURGERY — LAPAROSCOPIC CHOLECYSTECTOMY
Anesthesia: General

## 2018-11-11 MED ORDER — SODIUM CHLORIDE 0.9 % IV SOLN
2.0000 g | Freq: Once | INTRAVENOUS | Status: AC
  Administered 2018-11-11: 2 g via INTRAVENOUS

## 2018-11-11 MED ORDER — MIDAZOLAM HCL 5 MG/5ML IJ SOLN
INTRAMUSCULAR | Status: DC | PRN
  Administered 2018-11-11: 2 mg via INTRAVENOUS

## 2018-11-11 MED ORDER — SCOPOLAMINE 1 MG/3DAYS TD PT72
MEDICATED_PATCH | TRANSDERMAL | Status: DC | PRN
  Administered 2018-11-11: 1 via TRANSDERMAL

## 2018-11-11 MED ORDER — FENTANYL CITRATE (PF) 100 MCG/2ML IJ SOLN
INTRAMUSCULAR | Status: DC | PRN
  Administered 2018-11-11 (×2): 100 ug via INTRAVENOUS

## 2018-11-11 MED ORDER — MORPHINE SULFATE (PF) 4 MG/ML IV SOLN
4.0000 mg | Freq: Once | INTRAVENOUS | Status: AC
  Administered 2018-11-11: 4 mg via INTRAVENOUS
  Filled 2018-11-11: qty 1

## 2018-11-11 MED ORDER — LACTATED RINGERS IV SOLN
INTRAVENOUS | Status: DC
  Administered 2018-11-11 (×3): via INTRAVENOUS

## 2018-11-11 MED ORDER — METHOCARBAMOL 500 MG PO TABS: 500.0000 mg | ORAL_TABLET | Freq: Four times a day (QID) | ORAL | Status: DC | PRN

## 2018-11-11 MED ORDER — ONDANSETRON HCL 4 MG/2ML IJ SOLN
4.0000 mg | Freq: Once | INTRAMUSCULAR | Status: AC
  Administered 2018-11-11: 4 mg via INTRAVENOUS
  Filled 2018-11-11: qty 2

## 2018-11-11 MED ORDER — SODIUM CHLORIDE 0.9 % IV SOLN
INTRAVENOUS | Status: AC
  Filled 2018-11-11: qty 20

## 2018-11-11 MED ORDER — DIPHENHYDRAMINE HCL 50 MG/ML IJ SOLN: 12.5000 mg | Freq: Four times a day (QID) | INTRAMUSCULAR | Status: DC | PRN

## 2018-11-11 MED ORDER — PHENYLEPHRINE HCL 10 MG/ML IJ SOLN
INTRAMUSCULAR | Status: DC | PRN
  Administered 2018-11-11 (×2): 80 ug via INTRAVENOUS
  Administered 2018-11-11 (×2): 120 ug via INTRAVENOUS
  Administered 2018-11-11 (×2): 80 ug via INTRAVENOUS

## 2018-11-11 MED ORDER — ONDANSETRON HCL 4 MG/2ML IJ SOLN: 4.0000 mg | Freq: Four times a day (QID) | INTRAMUSCULAR | Status: DC | PRN

## 2018-11-11 MED ORDER — CHLORHEXIDINE GLUCONATE CLOTH 2 % EX PADS: 6.0000 | MEDICATED_PAD | Freq: Once | CUTANEOUS | Status: DC

## 2018-11-11 MED ORDER — PANTOPRAZOLE SODIUM 40 MG IV SOLR
40.0000 mg | Freq: Every day | INTRAVENOUS | Status: DC
  Administered 2018-11-11: 40 mg via INTRAVENOUS
  Filled 2018-11-11: qty 40

## 2018-11-11 MED ORDER — LACTATED RINGERS IV SOLN
INTRAVENOUS | Status: AC | PRN
  Administered 2018-11-11: 1000 mL via INTRAVENOUS

## 2018-11-11 MED ORDER — SCOPOLAMINE 1 MG/3DAYS TD PT72
MEDICATED_PATCH | TRANSDERMAL | Status: AC
  Filled 2018-11-11: qty 1

## 2018-11-11 MED ORDER — OXYCODONE HCL 5 MG PO TABS: 5.0000 mg | ORAL_TABLET | Freq: Once | ORAL | Status: DC | PRN

## 2018-11-11 MED ORDER — LIDOCAINE 2% (20 MG/ML) 5 ML SYRINGE
INTRAMUSCULAR | Status: AC
  Filled 2018-11-11: qty 5

## 2018-11-11 MED ORDER — DEXAMETHASONE SODIUM PHOSPHATE 10 MG/ML IJ SOLN
INTRAMUSCULAR | Status: DC | PRN
  Administered 2018-11-11: 8 mg via INTRAVENOUS

## 2018-11-11 MED ORDER — MIDAZOLAM HCL 2 MG/2ML IJ SOLN
INTRAMUSCULAR | Status: AC
  Filled 2018-11-11: qty 2

## 2018-11-11 MED ORDER — DIPHENHYDRAMINE HCL 12.5 MG/5ML PO ELIX: 12.5000 mg | ORAL_SOLUTION | Freq: Four times a day (QID) | ORAL | Status: DC | PRN

## 2018-11-11 MED ORDER — SODIUM CHLORIDE 0.9 % IV SOLN
INTRAVENOUS | Status: DC | PRN
  Administered 2018-11-11: 14:00:00

## 2018-11-11 MED ORDER — IOPAMIDOL (ISOVUE-300) INJECTION 61%
INTRAVENOUS | Status: AC
  Filled 2018-11-11: qty 50

## 2018-11-11 MED ORDER — DEXAMETHASONE SODIUM PHOSPHATE 10 MG/ML IJ SOLN
INTRAMUSCULAR | Status: AC
  Filled 2018-11-11: qty 1

## 2018-11-11 MED ORDER — PROPOFOL 10 MG/ML IV BOLUS
INTRAVENOUS | Status: DC | PRN
  Administered 2018-11-11: 200 mg via INTRAVENOUS
  Administered 2018-11-11: 150 mg via INTRAVENOUS

## 2018-11-11 MED ORDER — PROMETHAZINE HCL 25 MG/ML IJ SOLN: 6.2500 mg | INTRAMUSCULAR | Status: DC | PRN

## 2018-11-11 MED ORDER — SODIUM CHLORIDE 0.9 % IV SOLN
2.0000 g | INTRAVENOUS | Status: DC
  Filled 2018-11-11: qty 20

## 2018-11-11 MED ORDER — SUGAMMADEX SODIUM 500 MG/5ML IV SOLN
INTRAVENOUS | Status: AC
  Filled 2018-11-11: qty 5

## 2018-11-11 MED ORDER — ENOXAPARIN SODIUM 40 MG/0.4ML ~~LOC~~ SOLN
40.0000 mg | SUBCUTANEOUS | Status: DC
  Filled 2018-11-11: qty 0.4

## 2018-11-11 MED ORDER — ROCURONIUM BROMIDE 100 MG/10ML IV SOLN
INTRAVENOUS | Status: DC | PRN
  Administered 2018-11-11: 100 mg via INTRAVENOUS

## 2018-11-11 MED ORDER — FENTANYL CITRATE (PF) 100 MCG/2ML IJ SOLN
INTRAMUSCULAR | Status: AC
  Filled 2018-11-11: qty 2

## 2018-11-11 MED ORDER — ONDANSETRON 4 MG PO TBDP: 4.0000 mg | ORAL_TABLET | Freq: Four times a day (QID) | ORAL | Status: DC | PRN

## 2018-11-11 MED ORDER — ONDANSETRON HCL 4 MG/2ML IJ SOLN
INTRAMUSCULAR | Status: AC
  Filled 2018-11-11: qty 2

## 2018-11-11 MED ORDER — BUPIVACAINE-EPINEPHRINE 0.25% -1:200000 IJ SOLN
INTRAMUSCULAR | Status: DC | PRN
  Administered 2018-11-11: 30 mL

## 2018-11-11 MED ORDER — FENTANYL CITRATE (PF) 100 MCG/2ML IJ SOLN
25.0000 ug | INTRAMUSCULAR | Status: DC | PRN
  Administered 2018-11-11 (×2): 50 ug via INTRAVENOUS

## 2018-11-11 MED ORDER — PROPOFOL 10 MG/ML IV BOLUS
INTRAVENOUS | Status: AC
  Filled 2018-11-11: qty 20

## 2018-11-11 MED ORDER — ACETAMINOPHEN 10 MG/ML IV SOLN: 1000.0000 mg | Freq: Once | INTRAVENOUS | Status: DC | PRN

## 2018-11-11 MED ORDER — OXYCODONE HCL 5 MG/5ML PO SOLN: 5.0000 mg | Freq: Once | ORAL | Status: DC | PRN

## 2018-11-11 MED ORDER — FENTANYL CITRATE (PF) 100 MCG/2ML IJ SOLN: 25.0000 ug | INTRAMUSCULAR | Status: DC | PRN

## 2018-11-11 MED ORDER — DOCUSATE SODIUM 100 MG PO CAPS
100.0000 mg | ORAL_CAPSULE | Freq: Two times a day (BID) | ORAL | Status: DC
  Administered 2018-11-11: 100 mg via ORAL
  Filled 2018-11-11: qty 1

## 2018-11-11 MED ORDER — LIDOCAINE HCL (CARDIAC) PF 100 MG/5ML IV SOSY
PREFILLED_SYRINGE | INTRAVENOUS | Status: DC | PRN
  Administered 2018-11-11: 40 mg via INTRAVENOUS
  Administered 2018-11-11: 100 mg via INTRAVENOUS

## 2018-11-11 MED ORDER — SUGAMMADEX SODIUM 500 MG/5ML IV SOLN
INTRAVENOUS | Status: DC | PRN
  Administered 2018-11-11: 400 mg via INTRAVENOUS

## 2018-11-11 MED ORDER — SIMETHICONE 80 MG PO CHEW: 40.0000 mg | CHEWABLE_TABLET | Freq: Four times a day (QID) | ORAL | Status: DC | PRN

## 2018-11-11 MED ORDER — MORPHINE SULFATE (PF) 2 MG/ML IV SOLN
2.0000 mg | INTRAVENOUS | Status: DC | PRN
  Administered 2018-11-11 – 2018-11-12 (×2): 2 mg via INTRAVENOUS
  Filled 2018-11-11 (×2): qty 1

## 2018-11-11 MED ORDER — CIPROFLOXACIN IN D5W 400 MG/200ML IV SOLN: 400.0000 mg | INTRAVENOUS | Status: DC

## 2018-11-11 MED ORDER — OXYCODONE HCL 5 MG PO TABS
5.0000 mg | ORAL_TABLET | ORAL | Status: DC | PRN
  Administered 2018-11-11: 5 mg via ORAL
  Administered 2018-11-12: 10 mg via ORAL
  Filled 2018-11-11: qty 2
  Filled 2018-11-11: qty 1

## 2018-11-11 MED ORDER — SODIUM CHLORIDE 0.9 % IV SOLN
INTRAVENOUS | Status: DC
  Administered 2018-11-11 – 2018-11-12 (×2): via INTRAVENOUS

## 2018-11-11 MED ORDER — BUPIVACAINE HCL (PF) 0.25 % IJ SOLN
INTRAMUSCULAR | Status: AC
  Filled 2018-11-11: qty 30

## 2018-11-11 MED ORDER — ACETAMINOPHEN 650 MG RE SUPP: 650.0000 mg | Freq: Four times a day (QID) | RECTAL | Status: DC | PRN

## 2018-11-11 MED ORDER — ACETAMINOPHEN 325 MG PO TABS: 650.0000 mg | ORAL_TABLET | Freq: Four times a day (QID) | ORAL | Status: DC | PRN

## 2018-11-11 MED ORDER — FENTANYL CITRATE (PF) 250 MCG/5ML IJ SOLN
INTRAMUSCULAR | Status: AC
  Filled 2018-11-11: qty 5

## 2018-11-11 MED ORDER — HYDRALAZINE HCL 20 MG/ML IJ SOLN: 10.0000 mg | INTRAMUSCULAR | Status: DC | PRN

## 2018-11-11 MED ORDER — PHENYLEPHRINE 40 MCG/ML (10ML) SYRINGE FOR IV PUSH (FOR BLOOD PRESSURE SUPPORT)
PREFILLED_SYRINGE | INTRAVENOUS | Status: AC
  Filled 2018-11-11: qty 10

## 2018-11-11 MED ORDER — SUCCINYLCHOLINE CHLORIDE 200 MG/10ML IV SOSY
PREFILLED_SYRINGE | INTRAVENOUS | Status: AC
  Filled 2018-11-11: qty 10

## 2018-11-11 MED ORDER — LIP MEDEX EX OINT
TOPICAL_OINTMENT | CUTANEOUS | Status: AC
  Administered 2018-11-11: 18:00:00
  Filled 2018-11-11: qty 7

## 2018-11-11 SURGICAL SUPPLY — 35 items
ADH SKN CLS APL DERMABOND .7 (GAUZE/BANDAGES/DRESSINGS) ×1
APPLIER CLIP 5 13 M/L LIGAMAX5 (MISCELLANEOUS) ×2
APR CLP MED LRG 5 ANG JAW (MISCELLANEOUS) ×1
BAG SPEC RTRVL LRG 6X4 10 (ENDOMECHANICALS) ×1
CABLE HIGH FREQUENCY MONO STRZ (ELECTRODE) ×2 IMPLANT
CHLORAPREP W/TINT 26ML (MISCELLANEOUS) ×2 IMPLANT
CLIP APPLIE 5 13 M/L LIGAMAX5 (MISCELLANEOUS) ×1 IMPLANT
COVER MAYO STAND STRL (DRAPES) ×1 IMPLANT
COVER WAND RF STERILE (DRAPES) IMPLANT
DERMABOND ADVANCED (GAUZE/BANDAGES/DRESSINGS) ×1
DERMABOND ADVANCED .7 DNX12 (GAUZE/BANDAGES/DRESSINGS) ×1 IMPLANT
DRAPE C-ARM 42X120 X-RAY (DRAPES) ×1 IMPLANT
ELECT REM PT RETURN 15FT ADLT (MISCELLANEOUS) ×2 IMPLANT
GLOVE BIO SURGEON STRL SZ 6.5 (GLOVE) ×2 IMPLANT
GLOVE BIOGEL PI IND STRL 7.0 (GLOVE) ×1 IMPLANT
GLOVE BIOGEL PI INDICATOR 7.0 (GLOVE) ×1
GOWN STRL REUS W/TWL 2XL LVL3 (GOWN DISPOSABLE) ×2 IMPLANT
GOWN STRL REUS W/TWL XL LVL3 (GOWN DISPOSABLE) ×4 IMPLANT
IRRIG SUCT STRYKERFLOW 2 WTIP (MISCELLANEOUS) ×2
IRRIGATION SUCT STRKRFLW 2 WTP (MISCELLANEOUS) ×1 IMPLANT
IV CATH 14GX2 1/4 (CATHETERS) IMPLANT
KIT BASIN OR (CUSTOM PROCEDURE TRAY) ×2 IMPLANT
POUCH SPECIMEN RETRIEVAL 10MM (ENDOMECHANICALS) ×2 IMPLANT
SCISSORS LAP 5X35 DISP (ENDOMECHANICALS) ×2 IMPLANT
SET CHOLANGIOGRAPH MIX (MISCELLANEOUS) ×1 IMPLANT
SET TUBE SMOKE EVAC HIGH FLOW (TUBING) ×2 IMPLANT
SLEEVE XCEL OPT CAN 5 100 (ENDOMECHANICALS) ×4 IMPLANT
SUT VIC AB 2-0 SH 27 (SUTURE) ×2
SUT VIC AB 2-0 SH 27X BRD (SUTURE) ×1 IMPLANT
SUT VIC AB 4-0 PS2 18 (SUTURE) ×2 IMPLANT
TOWEL OR 17X26 10 PK STRL BLUE (TOWEL DISPOSABLE) ×2 IMPLANT
TOWEL OR NON WOVEN STRL DISP B (DISPOSABLE) ×2 IMPLANT
TRAY LAPAROSCOPIC (CUSTOM PROCEDURE TRAY) ×2 IMPLANT
TROCAR BLADELESS OPT 5 100 (ENDOMECHANICALS) ×2 IMPLANT
TROCAR XCEL BLUNT TIP 100MML (ENDOMECHANICALS) ×2 IMPLANT

## 2018-11-11 NOTE — Anesthesia Procedure Notes (Signed)
Procedure Name: Intubation Date/Time: 11/11/2018 1:37 PM Performed by: Thornell Mule, CRNA Pre-anesthesia Checklist: Patient identified, Emergency Drugs available, Suction available and Patient being monitored Patient Re-evaluated:Patient Re-evaluated prior to induction Oxygen Delivery Method: Circle system utilized Preoxygenation: Pre-oxygenation with 100% oxygen Induction Type: IV induction Ventilation: Mask ventilation without difficulty Laryngoscope Size: Miller and 3 Grade View: Grade I Tube type: Oral Tube size: 7.0 mm Number of attempts: 1 Airway Equipment and Method: Stylet and Oral airway Placement Confirmation: ETT inserted through vocal cords under direct vision,  positive ETCO2 and breath sounds checked- equal and bilateral Secured at: 20 cm Tube secured with: Tape Dental Injury: Teeth and Oropharynx as per pre-operative assessment

## 2018-11-11 NOTE — Transfer of Care (Signed)
Immediate Anesthesia Transfer of Care Note  Patient: Janet Watson  Procedure(s) Performed: LAPAROSCOPIC CHOLECYSTECTOMY WITH INTRA OPERATIVE CHOLANGIOGRAM (N/A )  Patient Location: PACU  Anesthesia Type:General  Level of Consciousness: awake, alert  and oriented  Airway & Oxygen Therapy: Patient Spontanous Breathing and Patient connected to face mask oxygen  Post-op Assessment: Report given to RN and Post -op Vital signs reviewed and stable  Post vital signs: Reviewed and stable  Last Vitals:  Vitals Value Taken Time  BP 115/59 11/11/2018  3:12 PM  Temp    Pulse 78 11/11/2018  3:13 PM  Resp 18 11/11/2018  3:13 PM  SpO2 100 % 11/11/2018  3:13 PM  Vitals shown include unvalidated device data.  Last Pain:  Vitals:   11/11/18 1300  TempSrc:   PainSc: 7          Complications: No apparent anesthesia complications

## 2018-11-11 NOTE — H&P (Signed)
[facility] Admission Note  Janet Watson [birth year]  [id number].    Requesting MD: Dr. Patria Mane Chief Complaint/Reason for Consult: Abdominal Pain  HPI:  Janet Watson is a 58 y.o. female who presented to [facility] long ED today with abdominal pain and nausea.  Patient reports she started having intermittent abdominal pain on Saturday after a large fatty meal.  On Wednesday after eating teriyaki chicken she began having epigastric and right upper quadrant abdominal pain again with associated nausea as well as vomiting.  She was seen in the ED and diagnosed with symptomatic cholelithiasis.  She was discharged home on oral pain medication.  She reports since that time her pain has been persistent.  She was seen by her primary care provider today who sent her to the emergency department for further evaluation. Patient reports he had some subjective fevers and chills yesterday.  She has had an associated decreased appetite.  No chest pain or shortness of breath.   Last meal 630am - coffee Last BM yesterday AM  PMH significant for HTN, Hypothyroidism Abdominal surgical history: None Anticoagulants: None No Tbcc use Occasional alcohol use.  1X/month  Employment: [employer]  ROS: Review of Systems  All other systems reviewed and are negative.   All systems reviewed and otherwise negative except for as above  Family History  Problem Relation Age of Onset  . Heart disease Mother   . Hyperlipidemia Mother   . Hypertension Mother   . Mental illness Mother   . Hypertension Father   . Heart disease Maternal Uncle     Past Medical History:  Diagnosis Date  . ADHD (attention deficit hyperactivity disorder)   . Arthritis   . Hypertension     Past Surgical History:  Procedure Laterality Date  . CARPAL TUNNEL RELEASE Bilateral   . KNEE SURGERY Bilateral     Social History:  reports that she has never smoked. She has never used smokeless tobacco. She reports current alcohol  use. She reports that she does not use drugs.  Allergies:  Allergies  Allergen Reactions  . Molds & Smuts Rash  . Amoxicillin-Pot Clavulanate Diarrhea  . Codeine Nausea And Vomiting  . Erythromycin Nausea And Vomiting  . Flurbiprofen Diarrhea    Medications Prior to Admission  Medication Sig Dispense Refill  . acetaminophen (TYLENOL) 325 MG tablet Take 650 mg by mouth every 6 (six) hours as needed for mild pain or headache.    . Calcium-Phosphorus-Vitamin D (CALCIUM GUMMIES PO) Take 2 each by mouth at bedtime.    . dicyclomine (BENTYL) 20 MG tablet Take 1 tablet (20 mg total) by mouth 2 (two) times daily. 20 tablet 0  . Eszopiclone 3 MG TABS Take 3 mg by mouth at bedtime.     Marland Kitchen etodolac (LODINE) 500 MG tablet Take 500 mg by mouth daily.     . irbesartan-hydrochlorothiazide (AVALIDE) 150-12.5 MG tablet Take 1 tablet by mouth daily.    Marland Kitchen levonorgestrel (MIRENA) 20 MCG/24HR IUD 1 each by Intrauterine route once.    Marland Kitchen levothyroxine (SYNTHROID, LEVOTHROID) 75 MCG tablet Take 75 mcg by mouth daily before breakfast.     . Magnesium 200 MG TABS Take 400 mg by mouth 2 (two) times daily.    . Multiple Vitamins-Minerals (MULTIVITAMIN ADULTS) TABS Take 1 tablet by mouth daily.    . ondansetron (ZOFRAN ODT) 4 MG disintegrating tablet Take 1 tablet (4 mg total) by mouth every 8 (eight) hours as needed for nausea or vomiting. 20 tablet 0  .  oxyCODONE (ROXICODONE) 5 MG immediate release tablet Take 1 tablet (5 mg total) by mouth every 4 (four) hours as needed for severe pain. 6 tablet 0  . Vitamin D, Cholecalciferol, 25 MCG (1000 UT) CAPS Take 1,000 Units by mouth daily.      Prior to Admission medications   Medication Sig Start Date End Date Taking? Authorizing Provider  acetaminophen (TYLENOL) 325 MG tablet Take 650 mg by mouth every 6 (six) hours as needed for mild pain or headache.   Yes [provider]  Calcium-Phosphorus-Vitamin D (CALCIUM GUMMIES PO) Take 2 each by mouth at bedtime.    Yes [provider]  dicyclomine (BENTYL) 20 MG tablet Take 1 tablet (20 mg total) by mouth 2 (two) times daily. 11/10/18  Yes Sabas SousBero, Breeze Berringer M, MD  Eszopiclone 3 MG TABS Take 3 mg by mouth at bedtime.  12/12/17  Yes [provider]  etodolac (LODINE) 500 MG tablet Take 500 mg by mouth daily.    Yes [provider]  irbesartan-hydrochlorothiazide (AVALIDE) 150-12.5 MG tablet Take 1 tablet by mouth daily.   Yes [provider]  levonorgestrel (MIRENA) 20 MCG/24HR IUD 1 each by Intrauterine route once.   Yes [provider]  levothyroxine (SYNTHROID, LEVOTHROID) 75 MCG tablet Take 75 mcg by mouth daily before breakfast.  12/31/17  Yes [provider]  Magnesium 200 MG TABS Take 400 mg by mouth 2 (two) times daily.   Yes [provider]  Multiple Vitamins-Minerals (MULTIVITAMIN ADULTS) TABS Take 1 tablet by mouth daily.   Yes [provider]  ondansetron (ZOFRAN ODT) 4 MG disintegrating tablet Take 1 tablet (4 mg total) by mouth every 8 (eight) hours as needed for nausea or vomiting. 11/10/18  Yes Bero, Elmer SowMichael M, MD  oxyCODONE (ROXICODONE) 5 MG immediate release tablet Take 1 tablet (5 mg total) by mouth every 4 (four) hours as needed for severe pain. 11/10/18  Yes Sabas SousBero, Alanee Ting M, MD  Vitamin D, Cholecalciferol, 25 MCG (1000 UT) CAPS Take 1,000 Units by mouth daily.   Yes [provider]    Blood pressure 121/71, pulse (!) 108, temperature 98.3 F (36.8 C), temperature source Oral, resp. rate 15, height 5\' 5"  (1.651 m), weight 92.9 kg, SpO2 93 %. Physical Exam: General: pleasant, WD/WN white female who is laying in bed in NAD HEENT: head is normocephalic, atraumatic.  Sclera are noninjected.  Pupils equal and round.  Ears and nose without any masses or lesions.  Mouth is pink and moist. Dentition fair Heart: regular, rate, and rhythm.  No obvious murmurs, gallops, or rubs noted.  Palpable pedal pulses bilaterally Lungs:  CTAB, no wheezes, rhonchi, or rales noted.  Respiratory effort nonlabored UJW:JXBJAbd:Soft, ND, tenderness in the RUQ without r/r/g. +BS, no masses, hernias, or organomegaly MS: all 4 extremities are symmetrical with no cyanosis, clubbing, or edema. Skin: warm and dry with no masses, lesions, or rashes Psych: A&Ox3 with an appropriate affect. Neuro: cranial nerves grossly intact, extremity CSM intact bilaterally, normal speech  Results for orders placed or performed during the hospital encounter of 11/11/18 (from the past 48 hour(s))  CBC     Status: Abnormal   Collection Time: 11/11/18 11:26 AM  Result Value Ref Range   WBC 11.8 (H) 4.0 - 10.5 K/uL   RBC 4.85 3.87 - 5.11 MIL/uL   Hemoglobin 15.1 (H) 12.0 - 15.0 g/dL   HCT 47.847.3 (H) 29.536.0 - 62.146.0 %   MCV 97.5 80.0 - 100.0 fL   MCH  31.1 26.0 - 34.0 pg   MCHC 31.9 30.0 - 36.0 g/dL   RDW 18.8 41.6 - 60.6 %   Platelets 267 150 - 400 K/uL   nRBC 0.0 0.0 - 0.2 %    Comment: Performed at [facility], [address], [place], [place] [address]  Comprehensive metabolic panel     Status: Abnormal   Collection Time: 11/11/18 11:26 AM  Result Value Ref Range   Sodium 136 135 - 145 mmol/L   Potassium 4.2 3.5 - 5.1 mmol/L   Chloride 98 98 - 111 mmol/L   CO2 26 22 - 32 mmol/L   Glucose, Bld 108 (H) 70 - 99 mg/dL   BUN 11 6 - 20 mg/dL   Creatinine, Ser 1.09 0.44 - 1.00 mg/dL   Calcium 9.3 8.9 - 32.3 mg/dL   Total Protein 8.2 (H) 6.5 - 8.1 g/dL   Albumin 4.1 3.5 - 5.0 g/dL   AST 47 (H) 15 - 41 U/L   ALT 44 0 - 44 U/L   Alkaline Phosphatase 86 38 - 126 U/L   Total Bilirubin 2.3 (H) 0.3 - 1.2 mg/dL   GFR calc non Af Amer >60 >60 mL/min   GFR calc Af Amer >60 >60 mL/min   Anion gap 12 5 - 15    Comment: Performed at [facility], [address], [place], [place] [address]  Lipase, blood     Status: None   Collection Time: 11/11/18 11:26 AM  Result Value Ref Range   Lipase 33 11 - 51 U/L    Comment: Performed at  [facility], [address], [place], [place] [address]   US Abdomen Limited Ruq  Result Date: 11/10/2018 CLINICAL DATA:  Upper abdominal pain with nausea and vomiting EXAM: ULTRASOUND ABDOMEN LIMITED RIGHT UPPER QUADRANT COMPARISON:  None. FINDINGS: Gallbladder: Within the gallbladder, there are echogenic foci which move and shadow consistent with cholelithiasis. Largest gallstone measures 7 mm in length. There is no appreciable gallbladder wall thickening or pericholecystic fluid. No sonographic Murphy sign noted by sonographer. Common bile duct: Diameter: 6 mm. No intrahepatic or extrahepatic biliary duct dilatation. Liver: There is a cyst in the left lobe of the liver measuring 2.1 x 1.5 x 1.9 cm. Within normal limits in parenchymal echogenicity. Portal vein is patent on color Doppler imaging with normal direction of blood flow towards the liver. IMPRESSION: 1. Cholelithiasis. No gallbladder wall thickening or pericholecystic fluid. 2. cyst in left lobe of liver. Liver otherwise appears unremarkable. Electronically Signed   By: Bretta Bang III M.D.   On: 11/10/2018 09:09      Assessment/Plan Early cholecystitis  -Patient's ultrasound on 2/27 shows cholelithiasis without gallbladder wall thickening or pericholecystic fluid.  She has had persistent pain since that time despite oral pain medication.  Labs today with elevated WBC at 11.8.  T bili 2.3, AST 47.  Lipase within normal limits.  Recommended laparoscopic cholecystectomy with IOC. I have explained the procedure, risks, and aftercare of cholecystectomy.  Risks include but are not limited to bleeding, infection, wound problems, anesthesia, diarrhea, bile leak, injury to common bile duct/liver/intestine.  She seems to understand and agrees to proceed.  Jacinto Halim, [facility] 11/11/2018, 1:02 PM Pager: [phone] Mon-Thurs 7:00 am-4:30 pm Fri 7:00 am -11:30 AM Sat-Sun 7:00 am-11:30 am

## 2018-11-11 NOTE — Anesthesia Postprocedure Evaluation (Signed)
Anesthesia Post Note  Patient: Janet Watson  Procedure(s) Performed: LAPAROSCOPIC CHOLECYSTECTOMY WITH INTRA OPERATIVE CHOLANGIOGRAM (N/A )     Patient location during evaluation: PACU Anesthesia Type: General Level of consciousness: awake and alert Pain management: pain level controlled Vital Signs Assessment: post-procedure vital signs reviewed and stable Respiratory status: spontaneous breathing, nonlabored ventilation, respiratory function stable and patient connected to nasal cannula oxygen Cardiovascular status: blood pressure returned to baseline and stable Postop Assessment: no apparent nausea or vomiting Anesthetic complications: no    Last Vitals:  Vitals:   11/11/18 1615 11/11/18 1639  BP:  119/70  Pulse:  73  Resp:  14  Temp: 36.8 C 36.7 C  SpO2:  95%    Last Pain:  Vitals:   11/11/18 1639  TempSrc: Oral  PainSc:                  [name] DAVID

## 2018-11-11 NOTE — ED Triage Notes (Signed)
Patient c/o "gall bladder pain" and is suppose to have surgery today or tomorrow.

## 2018-11-11 NOTE — Op Note (Signed)
11/11/2018  3:02 PM  PATIENT:  Janet Watson  58 y.o. female  Patient Care Team: Merri Brunette, MD as PCP - General (Internal Medicine)  PRE-OPERATIVE DIAGNOSIS:  symptomatic cholelithiasis  POST-OPERATIVE DIAGNOSIS:  Acute cholecystitis  PROCEDURE:  LAPAROSCOPIC CHOLECYSTECTOMY WITH INTRA-OPERATIVE CHOLANGIOGRAM   Surgeon(s): Romie Levee, MD  ASSISTANT: Leary Roca PA   ANESTHESIA:   local and general  EBL: 68ml  Total I/O In: 1000 [I.V.:1000] Out: 20 [Blood:20]  DRAINS: none   SPECIMEN:  Source of Specimen:  gallbladder  DISPOSITION OF SPECIMEN:  PATHOLOGY  COUNTS:  YES  PLAN OF CARE: Admit for overnight observation  PATIENT DISPOSITION:  PACU - hemodynamically stable.  INDICATION: 57 y.o. F with several days of pain and nausea.  RUQ US shows cholelithiasis.  Tbili elevated from yesterday.  The anatomy & physiology of hepatobiliary & pancreatic function was discussed.  The pathophysiology of gallbladder dysfunction was discussed.  Natural history risks without surgery was discussed.   I feel the risks of no intervention will lead to serious problems that outweigh the operative risks; therefore, I recommended cholecystectomy to remove the pathology.  I explained laparoscopic techniques with possible need for an open approach.  Probable cholangiogram to evaluate the bilary tract was explained as well.    Risks such as bleeding, infection, abscess, leak, injury to other organs, need for further treatment, heart attack, death, and other risks were discussed.  I noted a good likelihood this will help address the problem.  Possibility that this will not correct all abdominal symptoms was explained.  Goals of post-operative recovery were discussed as well.    OR FINDINGS: acute cholecystitis  DESCRIPTION:   The patient was identified & brought into the operating room. The patient was positioned supine with arms tucked. SCDs were active during the entire case. The  patient underwent general anesthesia without any difficulty.  The abdomen was prepped and draped in a sterile fashion. A Surgical Timeout was performed and confirmed our plan.  We positioned the patient in reverse [place] & right side up.  I placed a Hassan laparoscopic port through the umbilicus using open entry technique.  Entry was clean. There were no adhesions to the anterior abdominal wall supraumbilically.  We induced carbon dioxide insufflation. Camera inspection revealed no injury.    I proceeded to continue with laparoscopic technique. I placed a 5 mm port in mid subcostal region, another 39mm port in the right flank near the anterior axillary line, and a 65mm port in the left subxiphoid region obliquely within the falciform ligament.  I turned attention to the right upper quadrant.  The gallbladder was aspirated to allow for retraction.  The gallbladder fundus was elevated cephalad. I used cautery and blunt dissection to free the peritoneal coverings between the gallbladder and the liver on the posteriolateral and anteriomedial walls.   I used careful blunt and cautery dissection with a blunt dissector to help get a good critical view of the cystic artery and cystic duct. I did further dissection to free a few centimeters of the  gallbladder off the liver bed to get a good critical view of the infundibulum and cystic duct. I mobilized the cystic artery.  I skeletonized the cystic duct.  After getting a good 360 view, I decided to perform a cholangiogram.  I placed a clip on the infundibulum.   I did a partial cystic duct-otomy and ensured patency. I placed a 5 F cholangiocatheter through a puncture site at the right subcostal ridge of  the abdominal wall and directed it into the cystic duct.  This was secured with a clip. We ran a cholangiogram with dilute radio-opaque contrast and continuous fluoroscopy.  Contrast flowed from a side branch consistent with cystic duct cannulization. Contrast  flowed up the common hepatic duct into the right and left intrahepatic chains out to secondary radicals. Contrast flowed down the common bile duct easily across the normal ampulla into the duodenum.  This was consistent with a normal cholangiogram.  I removed the cholangiocatheter.  I placed clips on the cystic duct x3.  I completed cystic duct transection.   I placed clips on the cystic artery x3 with 2 proximally.  I ligated the cystic artery using scissors. I freed the gallbladder from its remaining attachments to the liver. I ensured hemostasis on the gallbladder fossa of the liver and elsewhere. I inspected the rest of the abdomen & detected no injury nor bleeding elsewhere.  I irrigated the RUQ with normal saline.  I removed the gallbladder through the umbilical port site.  I closed the umbilical fascia using 0 Vicryl stitches.   I closed the skin using 4-0 vicryl stitch.  Sterile dressings were applied. The patient was extubated & arrived in the PACU in stable condition.  I had discussed postoperative care with the patient in the holding area.   I will discuss  operative findings and postoperative goals / instructions with the patient's family.  Instructions are written in the chart as well.

## 2018-11-11 NOTE — Anesthesia Preprocedure Evaluation (Addendum)
Anesthesia Evaluation  Patient identified by MRN, date of birth, ID band Patient awake    Reviewed: Allergy & Precautions, NPO status , Patient's Chart, lab work & pertinent test results  History of Anesthesia Complications Negative for: history of anesthetic complications  Airway Mallampati: II  TM Distance: >3 FB Neck ROM: Full    Dental  (+) Teeth Intact, Dental Advisory Given   Pulmonary neg pulmonary ROS,    Pulmonary exam normal breath sounds clear to auscultation       Cardiovascular hypertension, Pt. on medications Normal cardiovascular exam Rhythm:Regular Rate:Normal     Neuro/Psych ADHDnegative neurological ROS     GI/Hepatic Neg liver ROS, Acute cholecystitis   Endo/Other  Hypothyroidism   Renal/GU negative Renal ROS     Musculoskeletal  (+) Arthritis ,   Abdominal   Peds  Hematology negative hematology ROS (+)   Anesthesia Other Findings Day of surgery medications reviewed with the patient.  Reproductive/Obstetrics                            Anesthesia Physical Anesthesia Plan  ASA: II and emergent  Anesthesia Plan: General   Post-op Pain Management:    Induction: Intravenous, Cricoid pressure planned and Rapid sequence  PONV Risk Score and Plan: 4 or greater and Treatment may vary due to age or medical condition, Ondansetron, Dexamethasone, Midazolam and Scopolamine patch - Pre-op  Airway Management Planned: Oral ETT  Additional Equipment: None  Intra-op Plan:   Post-operative Plan: Extubation in OR  Informed Consent: I have reviewed the patients History and Physical, chart, labs and discussed the procedure including the risks, benefits and alternatives for the proposed anesthesia with the patient or authorized representative who has indicated his/her understanding and acceptance.     Dental advisory given  Plan Discussed with: CRNA  Anesthesia Plan  Comments:        Anesthesia Quick Evaluation

## 2018-11-11 NOTE — ED Notes (Signed)
ED Provider at bedside. 

## 2018-11-11 NOTE — ED Provider Notes (Signed)
[facility] Provider Note   CSN: [id number] Arrival date & time: 11/11/18  1040    History   Chief Complaint Chief Complaint  Patient presents with  . Abdominal Pain    HPI Wisdom Mcnamar is a 58 y.o. female.     HPI Patient is a 58 year old female who was diagnosed yesterday with symptomatic cholelithiasis.  She was discharged home with outpatient surgical follow-up instructions.  She returns the emergency department today because of ongoing discomfort and pain.  She went saw her primary care physician who reached out to surgery who is expecting her here in the emergency department.  Reports nausea and pain.  No vomiting this morning.  She reports ongoing right upper quadrant tenderness at this time.  No fevers.  Symptoms have persisted.  Symptoms are moderate in severity.   Past Medical History:  Diagnosis Date  . ADHD (attention deficit hyperactivity disorder)   . Arthritis   . Hypertension     Patient Active Problem List   Diagnosis Date Noted  . Benign essential HTN 07/19/2015  . Hypothyroidism 07/19/2015  . ADD (attention deficit disorder) 07/19/2015  . Plantar fasciitis 07/19/2015    Past Surgical History:  Procedure Laterality Date  . CARPAL TUNNEL RELEASE Bilateral   . KNEE SURGERY Bilateral      OB History   No obstetric history on file.      Home Medications    Prior to Admission medications   Medication Sig Start Date End Date Taking? Authorizing Provider  acetaminophen (TYLENOL) 325 MG tablet Take 650 mg by mouth every 6 (six) hours as needed for mild pain or headache.   Yes [provider]  Calcium-Phosphorus-Vitamin D (CALCIUM GUMMIES PO) Take 2 each by mouth at bedtime.   Yes [provider]  dicyclomine (BENTYL) 20 MG tablet Take 1 tablet (20 mg total) by mouth 2 (two) times daily. 11/10/18  Yes Sabas Sous, MD  Eszopiclone 3 MG TABS Take 3 mg by mouth at bedtime.  12/12/17  Yes [provider]  etodolac (LODINE) 500 MG tablet Take 500 mg by mouth daily.    Yes [provider]  irbesartan-hydrochlorothiazide (AVALIDE) 150-12.5 MG tablet Take 1 tablet by mouth daily.   Yes [provider]  levonorgestrel (MIRENA) 20 MCG/24HR IUD 1 each by Intrauterine route once.   Yes [provider]  levothyroxine (SYNTHROID, LEVOTHROID) 75 MCG tablet Take 75 mcg by mouth daily before breakfast.  12/31/17  Yes [provider]  Magnesium 200 MG TABS Take 400 mg by mouth 2 (two) times daily.   Yes [provider]  Multiple Vitamins-Minerals (MULTIVITAMIN ADULTS) TABS Take 1 tablet by mouth daily.   Yes [provider]  ondansetron (ZOFRAN ODT) 4 MG disintegrating tablet Take 1 tablet (4 mg total) by mouth every 8 (eight) hours as needed for nausea or vomiting. 11/10/18  Yes Bero, Elmer Sow, MD  oxyCODONE (ROXICODONE) 5 MG immediate release tablet Take 1 tablet (5 mg total) by mouth every 4 (four) hours as needed for severe pain. 11/10/18  Yes Sabas Sous, MD  Vitamin D, Cholecalciferol, 25 MCG (1000 UT) CAPS Take 1,000 Units by mouth daily.   Yes [provider]    Family History Family History  Problem Relation Age of Onset  . Heart disease Mother   . Hyperlipidemia Mother   . Hypertension Mother   . Mental illness Mother   . Hypertension Father   . Heart disease Maternal Uncle  Social History Social History   Tobacco Use  . Smoking status: Never Smoker  . Smokeless tobacco: Never Used  Substance Use Topics  . Alcohol use: Yes    Alcohol/week: 0.0 - 4.0 standard drinks    Comment: most often, doesn't drink  . Drug use: No     Allergies   Molds & smuts; Amoxicillin-pot clavulanate; Codeine; Erythromycin; and Flurbiprofen   Review of Systems Review of Systems  All other systems reviewed and are negative.    Physical Exam Updated Vital Signs BP 121/71   Pulse (!) 108   Temp 98.3 F (36.8 C) (Oral)   Resp  15   Ht 5\' 5"  (1.651 m)   Wt 92.9 kg   SpO2 93%   BMI 34.08 kg/m   Physical Exam Vitals signs and nursing note reviewed.  Constitutional:      General: She is not in acute distress.    Appearance: She is well-developed.  HENT:     Head: Normocephalic and atraumatic.  Neck:     Musculoskeletal: Normal range of motion.  Cardiovascular:     Rate and Rhythm: Normal rate and regular rhythm.     Heart sounds: Normal heart sounds.  Pulmonary:     Effort: Pulmonary effort is normal.     Breath sounds: Normal breath sounds.  Abdominal:     General: There is no distension.     Palpations: Abdomen is soft.     Tenderness: There is abdominal tenderness in the right upper quadrant.  Musculoskeletal: Normal range of motion.  Skin:    General: Skin is warm and dry.  Neurological:     Mental Status: She is alert and oriented to person, place, and time.  Psychiatric:        Judgment: Judgment normal.      ED Treatments / Results  Labs (all labs ordered are listed, but only abnormal results are displayed) Labs Reviewed  CBC - Abnormal; Notable for the following components:      Result Value   WBC 11.8 (*)    Hemoglobin 15.1 (*)    HCT 47.3 (*)    All other components within normal limits  COMPREHENSIVE METABOLIC PANEL - Abnormal; Notable for the following components:   Glucose, Bld 108 (*)    Total Protein 8.2 (*)    AST 47 (*)    Total Bilirubin 2.3 (*)    All other components within normal limits  LIPASE, BLOOD    EKG None  Radiology [place] Abdomen Limited Ruq  Result Date: 11/10/2018 CLINICAL DATA:  Upper abdominal pain with nausea and vomiting EXAM: ULTRASOUND ABDOMEN LIMITED RIGHT UPPER QUADRANT COMPARISON:  None. FINDINGS: Gallbladder: Within the gallbladder, there are echogenic foci which move and shadow consistent with cholelithiasis. Largest gallstone measures 7 mm in length. There is no appreciable gallbladder wall thickening or pericholecystic fluid. No sonographic  Murphy sign noted by sonographer. Common bile duct: Diameter: 6 mm. No intrahepatic or extrahepatic biliary duct dilatation. Liver: There is a cyst in the left lobe of the liver measuring 2.1 x 1.5 x 1.9 cm. Within normal limits in parenchymal echogenicity. Portal vein is patent on color Doppler imaging with normal direction of blood flow towards the liver. IMPRESSION: 1. Cholelithiasis. No gallbladder wall thickening or pericholecystic fluid. 2. cyst in left lobe of liver. Liver otherwise appears unremarkable. Electronically Signed   By: Bretta BangWilliam  Woodruff III M.D.   On: 11/10/2018 09:09    Procedures Procedures (including critical care time)  Medications  Ordered in ED Medications  lactated ringers infusion (has no administration in time range)  ondansetron (ZOFRAN) injection 4 mg (4 mg Intravenous Given 11/11/18 1142)  morphine 4 MG/ML injection 4 mg (4 mg Intravenous Given 11/11/18 1142)     Initial Impression / Assessment and Plan / ED Course  I have reviewed the triage vital signs and the nursing notes.  Pertinent labs & imaging results that were available during my care of the patient were reviewed by me and considered in my medical decision making (see chart for details).        Consultation to [facility] [place] [facility] who will admit the patient for symptomatic cholelithiasis and plan on operative management.  Pain and nausea treated here in the emergency department.  Labs obtained.  Final Clinical Impressions(s) / ED Diagnoses   Final diagnoses:  Recurrent biliary colic    ED Discharge Orders    None       Azalia Bilis, MD 11/11/18 1255

## 2018-11-11 NOTE — ED Notes (Signed)
Bed: FS23 Expected date:  Expected time:  Means of arrival:  Comments: [employer]

## 2018-11-12 LAB — HIV ANTIBODY (ROUTINE TESTING W REFLEX): HIV Screen 4th Generation wRfx: NONREACTIVE

## 2018-11-12 MED ORDER — OXYCODONE HCL 5 MG PO TABS: 5.0000 mg | ORAL_TABLET | ORAL | 0 refills | Status: AC | PRN

## 2018-11-12 NOTE — Plan of Care (Signed)
Reviewed discharge instructions with patient; copy given. IV removed. Patient ready for discharge.  

## 2018-11-12 NOTE — Plan of Care (Signed)
Patient ambulating in hall this morning; pain ok. Anticipating discharge. Will continue to monitor.

## 2018-11-12 NOTE — Discharge Summary (Signed)
Physician Discharge Summary  Patient ID: Janet Watson MRN: [id number] DOB/AGE: 1960/11/10 58 y.o.  Admit date: 11/11/2018 Discharge date: 11/12/2018  Admission Diagnoses: cholelithiasis  Discharge Diagnoses:  Active Problems:   Cholecystitis   Discharged Condition: good  Hospital Course: Pt admitted after emergent lap chole.  Her diet was advanced as tolerated.  By POD 1 she was tolerating a diet and PO pain meds.  She was ambulating well and felt to be in stable condition for discharge.  Consults: None  Significant Diagnostic Studies: labs: cbc, cmet  Treatments: IV hydration, antibiotics: Rocephin, analgesia: Oxycodone and surgery: lap chole  Discharge Exam: Blood pressure 110/62, pulse 65, temperature 97.9 F (36.6 C), temperature source Oral, resp. rate 16, height 5\' 5"  (1.651 m), weight 92.9 kg, SpO2 98 %. General appearance: alert and cooperative GI: normal findings: soft, appropriately tender Incision/Wound: clean, dry, intact  Disposition: Discharge disposition: 01-Home or Self Care        Allergies as of 11/12/2018      Reactions   Molds & Smuts Rash   Amoxicillin-pot Clavulanate Diarrhea   Codeine Nausea And Vomiting   Erythromycin Nausea And Vomiting   Flurbiprofen Diarrhea      Medication List    TAKE these medications   acetaminophen 325 MG tablet Commonly known as:  TYLENOL Take 650 mg by mouth every 6 (six) hours as needed for mild pain or headache.   CALCIUM GUMMIES PO Take 2 each by mouth at bedtime.   dicyclomine 20 MG tablet Commonly known as:  BENTYL Take 1 tablet (20 mg total) by mouth 2 (two) times daily.   Eszopiclone 3 MG Tabs Take 3 mg by mouth at bedtime.   etodolac 500 MG tablet Commonly known as:  LODINE Take 500 mg by mouth daily.   irbesartan-hydrochlorothiazide 150-12.5 MG tablet Commonly known as:  AVALIDE Take 1 tablet by mouth daily.   levonorgestrel 20 MCG/24HR IUD Commonly known as:  MIRENA 1 each by  Intrauterine route once.   levothyroxine 75 MCG tablet Commonly known as:  SYNTHROID, LEVOTHROID Take 75 mcg by mouth daily before breakfast.   Magnesium 200 MG Tabs Take 400 mg by mouth 2 (two) times daily.   MULTIVITAMIN ADULTS Tabs Take 1 tablet by mouth daily.   ondansetron 4 MG disintegrating tablet Commonly known as:  ZOFRAN ODT Take 1 tablet (4 mg total) by mouth every 8 (eight) hours as needed for nausea or vomiting.   oxyCODONE 5 MG immediate release tablet Commonly known as:  ROXICODONE Take 1 tablet (5 mg total) by mouth every 4 (four) hours as needed for severe pain.   Vitamin D (Cholecalciferol) 25 MCG (1000 UT) Caps Take 1,000 Units by mouth daily.      Follow-up Information    [facility] Follow up on 11/24/2018.   Specialty:  General Surgery Why:  03/12 at 3:45 pm. Please arrive 30 minutes prior to your appointment for paperwork. Please bring a copy of your photo ID and insurance card Contact information: [address] [place] [address] [phone]          Signed: Vanita Panda 11/12/2018, 9:16 AM

## 2018-11-12 NOTE — Discharge Instructions (Signed)

## 2018-11-14 ENCOUNTER — Encounter (HOSPITAL_COMMUNITY): Payer: Self-pay | Admitting: General Surgery

## 2019-03-15 ENCOUNTER — Other Ambulatory Visit: Payer: Self-pay | Admitting: *Deleted

## 2019-03-15 DIAGNOSIS — Z20822 Contact with and (suspected) exposure to covid-19: Secondary | ICD-10-CM

## 2019-03-21 LAB — NOVEL CORONAVIRUS, NAA: SARS-CoV-2, NAA: NOT DETECTED

## 2019-11-21 ENCOUNTER — Other Ambulatory Visit: Payer: Self-pay | Admitting: Internal Medicine

## 2019-11-21 DIAGNOSIS — K7689 Other specified diseases of liver: Secondary | ICD-10-CM

## 2019-11-29 ENCOUNTER — Ambulatory Visit
Admission: RE | Admit: 2019-11-29 | Discharge: 2019-11-29 | Disposition: A | Discharge status: Home / Self Care | Payer: 59 | Source: Ambulatory Visit | Attending: Internal Medicine | Admitting: Internal Medicine

## 2019-11-29 DIAGNOSIS — K7689 Other specified diseases of liver: Secondary | ICD-10-CM

## 2019-12-08 ENCOUNTER — Other Ambulatory Visit: Payer: Self-pay | Admitting: Internal Medicine

## 2019-12-08 DIAGNOSIS — K7689 Other specified diseases of liver: Secondary | ICD-10-CM

## 2020-11-25 ENCOUNTER — Other Ambulatory Visit: Payer: Self-pay | Admitting: Internal Medicine

## 2020-11-25 DIAGNOSIS — Z6839 Body mass index (BMI) 39.0-39.9, adult: Secondary | ICD-10-CM

## 2020-11-28 ENCOUNTER — Other Ambulatory Visit: Payer: 59

## 2020-12-02 ENCOUNTER — Ambulatory Visit
Admission: RE | Admit: 2020-12-02 | Discharge: 2020-12-02 | Disposition: A | Discharge status: Home / Self Care | Payer: 59 | Source: Ambulatory Visit | Attending: Internal Medicine | Admitting: Internal Medicine

## 2020-12-02 DIAGNOSIS — K7689 Other specified diseases of liver: Secondary | ICD-10-CM

## 2020-12-10 ENCOUNTER — Ambulatory Visit
Admission: RE | Admit: 2020-12-10 | Discharge: 2020-12-10 | Disposition: A | Discharge status: Home / Self Care | Payer: 59 | Source: Ambulatory Visit | Attending: Internal Medicine | Admitting: Internal Medicine

## 2020-12-10 DIAGNOSIS — Z6839 Body mass index (BMI) 39.0-39.9, adult: Secondary | ICD-10-CM

## 2021-12-08 IMAGING — CT CT CARDIAC CORONARY ARTERY CALCIUM SCORE
3 series · 14 of 20 positions shown, 16 images · non-contrast
Comparison: None.

CLINICAL DATA: 59-year-old Caucasian female with history of
hyperlipidemia, hypertension and family history of heart disease.

EXAM:
CT CARDIAC CORONARY ARTERY CALCIUM SCORE
TECHNIQUE: Non-contrast imaging through the heart was performed using
prospective ECG gating. Image post processing was performed on an
independent workstation, allowing for quantitative analysis of the
heart and coronary arteries. Note that this exam targets the heart
and the chest was not imaged in its entirety.

[Series 2: calcium scoring 2.00 qr36 bestdiast 69% hrt calciu · axial · 0.47mm/px · z∈[+1649,+1733]mm · 4 of 70 slices shown]
[im 14/70  vessel]
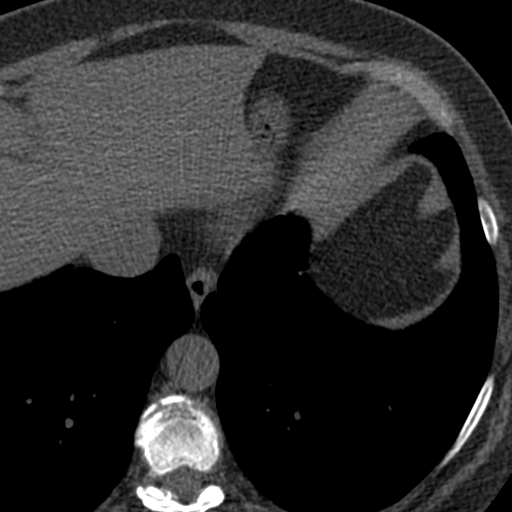
[im 28/70  vessel]
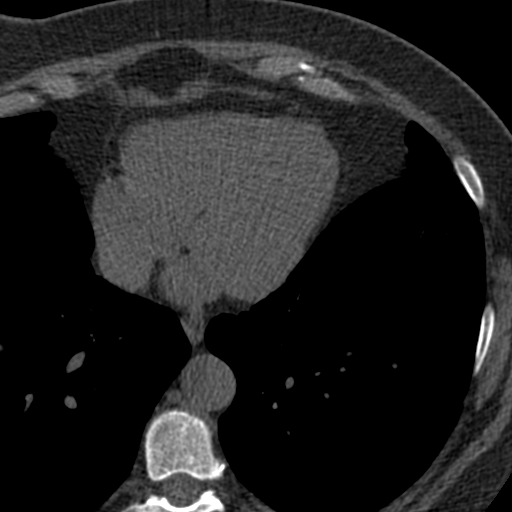
[im 42/70  vessel]
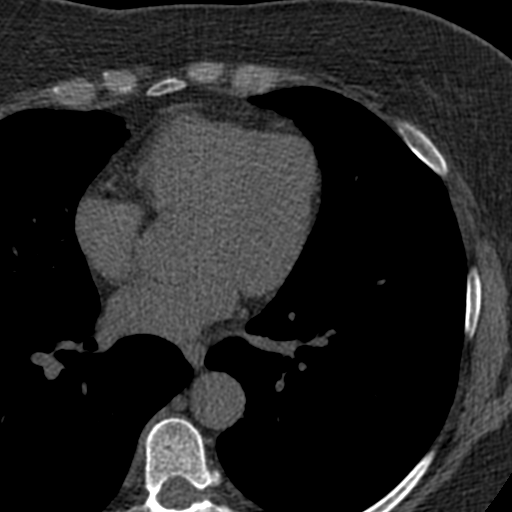
[im 56/70  vessel]
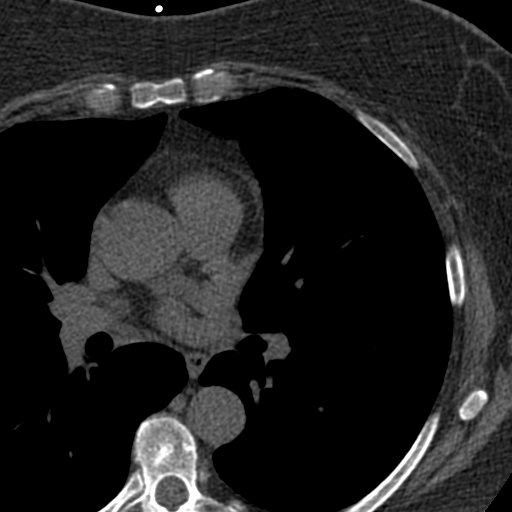

[Series 3: calcium scoring 2.00 br40 bestdiast 69% axial · axial · 0.67mm/px · z∈[+1645,+1737]mm · 5 of 70 slices shown, 7 images]
[im 12/70  vessel]
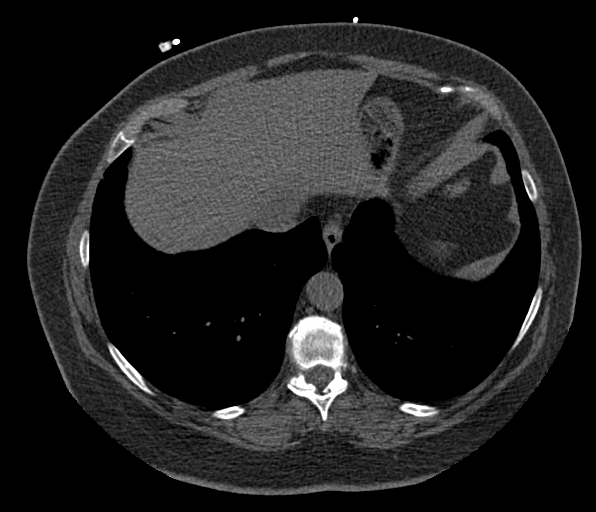
[im 12/70  lung]
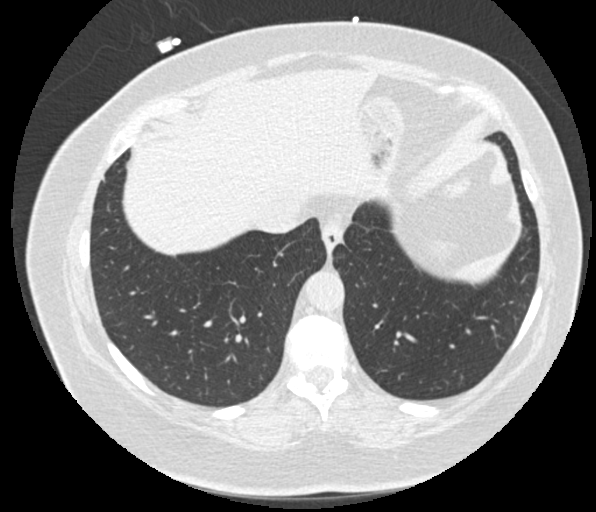
[im 24/70  vessel]
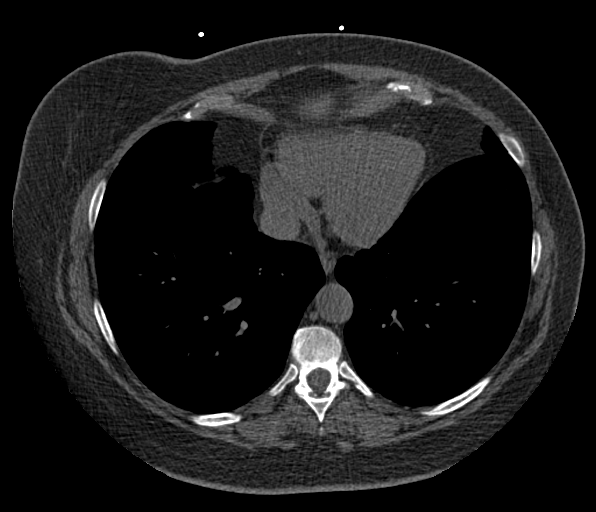
[im 35/70  vessel]
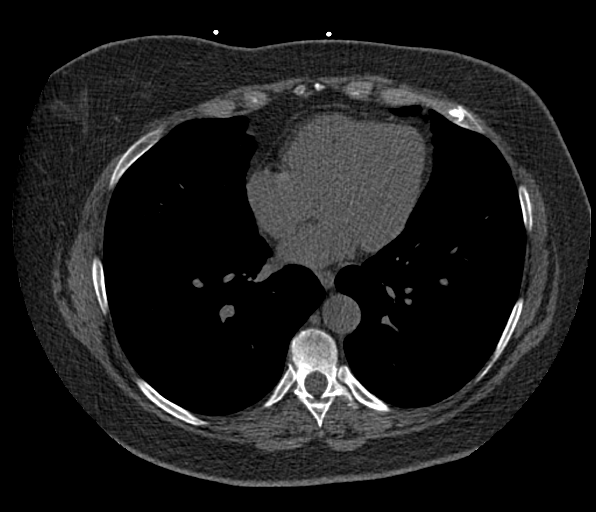
[im 47/70  vessel]
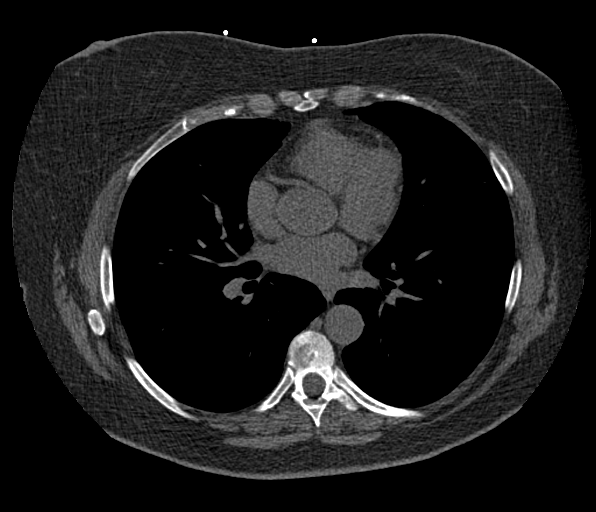
[im 58/70  vessel]
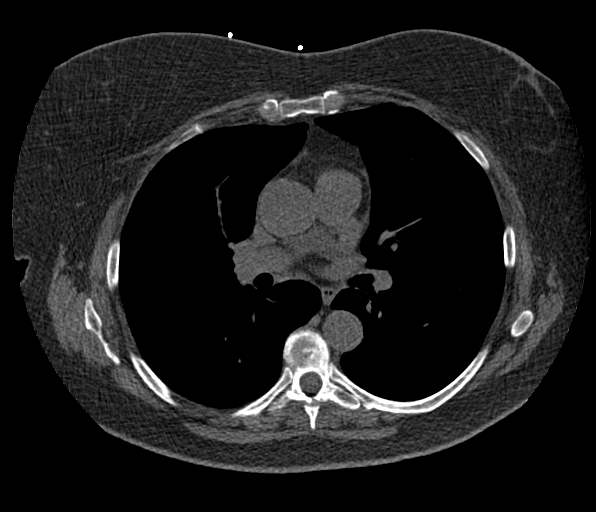
[im 58/70  lung]
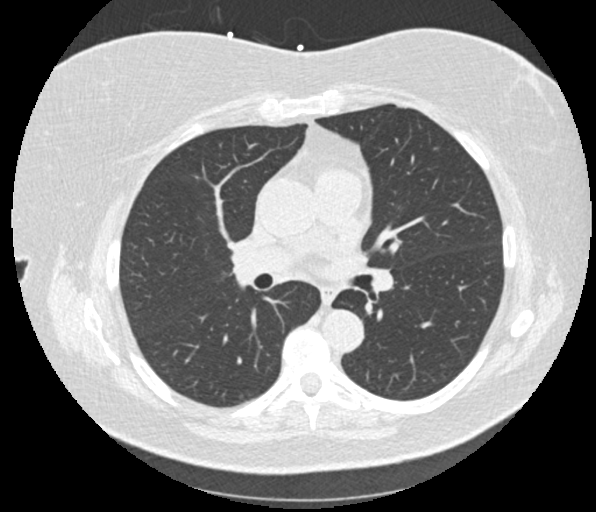

[Series 9: calcium scoring 2.00 br60 bestdiast 69% lungs · axial · 0.67mm/px · z∈[+1645,+1737]mm · 5 of 70 slices shown]
[im 12/70  vessel]
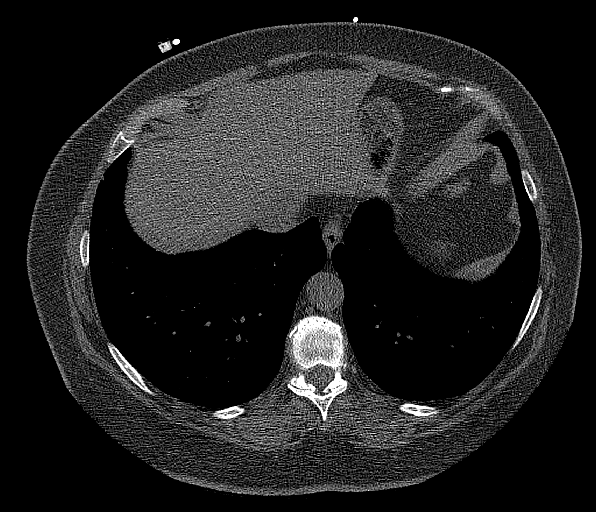
[im 24/70  vessel]
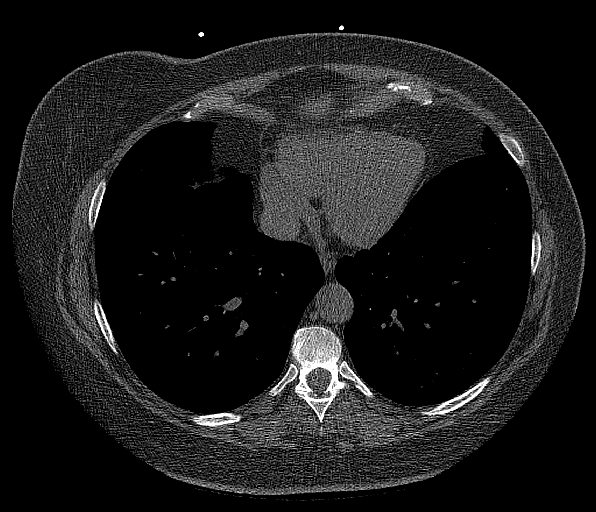
[im 35/70  vessel]
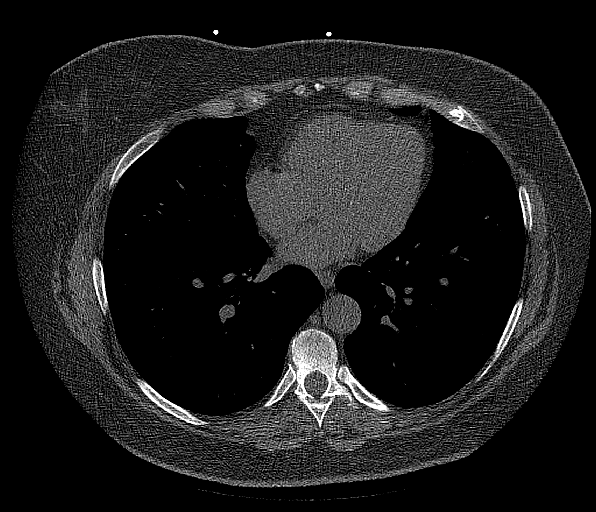
[im 47/70  vessel]
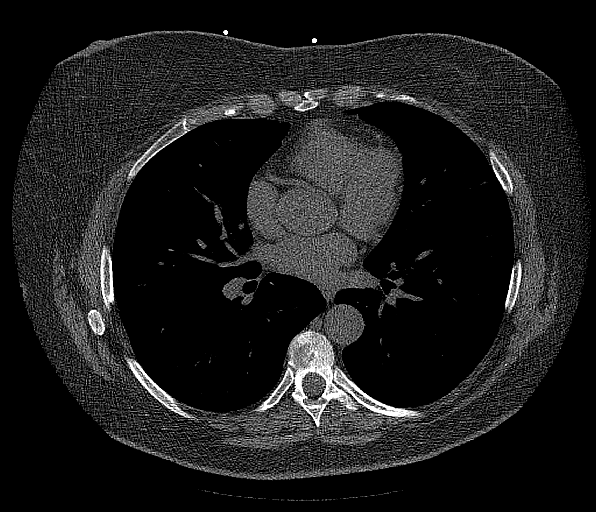
[im 58/70  vessel]
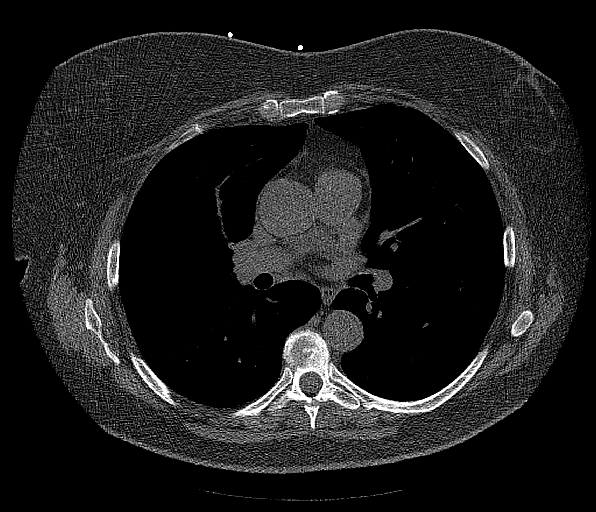

[14 of 20 positions shown; findings below may reference images not displayed]

FINDINGS: CORONARY CALCIUM SCORES:

Left Main: 0

LAD: 0

LCx: 0

RCA: 0

Total Agatston Score: 0

[HOSPITAL] percentile: 0

AORTA MEASUREMENTS:

Ascending Aorta: 38 mm

Descending Aorta: 26 mm

OTHER FINDINGS:

The heart size is within normal limits. No pericardial fluid
identified. Visualized segments of the thoracic aorta and central
pulmonary arteries are normal in caliber. Visualized mediastinum and
hilar regions demonstrate no lymphadenopathy or masses. Visualized
lungs show no evidence of pulmonary edema, consolidation,
pneumothorax, nodule or pleural fluid. Visualized upper abdomen and
bony structures are unremarkable.
IMPRESSION: Coronary calcium score of 0.

## 2021-12-17 DIAGNOSIS — K7689 Other specified diseases of liver: Secondary | ICD-10-CM | POA: Insufficient documentation

## 2021-12-17 DIAGNOSIS — E66812 Obesity, class 2: Secondary | ICD-10-CM | POA: Insufficient documentation

## 2021-12-17 DIAGNOSIS — L719 Rosacea, unspecified: Secondary | ICD-10-CM | POA: Insufficient documentation

## 2021-12-17 DIAGNOSIS — J309 Allergic rhinitis, unspecified: Secondary | ICD-10-CM | POA: Insufficient documentation

## 2021-12-17 DIAGNOSIS — K589 Irritable bowel syndrome without diarrhea: Secondary | ICD-10-CM | POA: Insufficient documentation

## 2021-12-17 DIAGNOSIS — J45909 Unspecified asthma, uncomplicated: Secondary | ICD-10-CM | POA: Insufficient documentation

## 2021-12-17 DIAGNOSIS — F5104 Psychophysiologic insomnia: Secondary | ICD-10-CM | POA: Insufficient documentation

## 2021-12-17 DIAGNOSIS — Z0001 Encounter for general adult medical examination with abnormal findings: Secondary | ICD-10-CM | POA: Insufficient documentation

## 2021-12-17 DIAGNOSIS — M1990 Unspecified osteoarthritis, unspecified site: Secondary | ICD-10-CM | POA: Insufficient documentation

## 2022-07-02 ENCOUNTER — Ambulatory Visit
Admission: RE | Admit: 2022-07-02 | Discharge: 2022-07-02 | Disposition: A | Discharge status: Home / Self Care | Payer: 59 | Source: Ambulatory Visit | Attending: Physician Assistant | Admitting: Physician Assistant

## 2022-07-02 VITALS — BP 164/103 | HR 92 | Temp 97.8°F | Resp 18

## 2022-07-02 DIAGNOSIS — N3000 Acute cystitis without hematuria: Secondary | ICD-10-CM | POA: Diagnosis present

## 2022-07-02 LAB — POCT URINALYSIS DIP (MANUAL ENTRY)
Bilirubin, UA: NEGATIVE
Glucose, UA: NEGATIVE mg/dL
Ketones, POC UA: NEGATIVE mg/dL
Nitrite, UA: NEGATIVE
Protein Ur, POC: NEGATIVE mg/dL
Spec Grav, UA: 1.01 (ref 1.010–1.025)
Urobilinogen, UA: 0.2 E.U./dL
pH, UA: 5.5 (ref 5.0–8.0)

## 2022-07-02 MED ORDER — NITROFURANTOIN MONOHYD MACRO 100 MG PO CAPS: 100.0000 mg | ORAL_CAPSULE | Freq: Two times a day (BID) | ORAL | 0 refills | Status: AC

## 2022-07-02 NOTE — ED Provider Notes (Signed)
[facility]    CSN: [id number] Arrival date & time: 07/02/22  1254      History   Chief Complaint Chief Complaint  Patient presents with   Vaginitis    HPI Janet Watson is a 61 y.o. female.   Patient here today for evaluation of dysuria that started a few days ago.  She reports that she is not having any vaginal discomfort.  She has not had any fever.  She denies any abdominal pain, nausea or vomiting.  She denies back pain.  She does not report treatment for symptoms.  The history is provided by the patient.    Past Medical History:  Diagnosis Date   ADHD (attention deficit hyperactivity disorder)    Arthritis    Hypertension     Patient Active Problem List   Diagnosis Date Noted   Cholecystitis 11/11/2018   Benign essential HTN 07/19/2015   Hypothyroidism 07/19/2015   ADD (attention deficit disorder) 07/19/2015   Plantar fasciitis 07/19/2015    Past Surgical History:  Procedure Laterality Date   CARPAL TUNNEL RELEASE Bilateral    CHOLECYSTECTOMY N/A 11/11/2018   Procedure: LAPAROSCOPIC CHOLECYSTECTOMY WITH INTRA OPERATIVE CHOLANGIOGRAM;  Surgeon: Leighton Ruff, MD;  Location: [facility];  Service: General;  Laterality: N/A;   KNEE SURGERY Bilateral     OB History   No obstetric history on file.      Home Medications    Prior to Admission medications   Medication Sig Start Date End Date Taking? Authorizing Provider  nitrofurantoin, macrocrystal-monohydrate, (MACROBID) 100 MG capsule Take 1 capsule (100 mg total) by mouth 2 (two) times daily. 07/02/22  Yes Francene Finders, PA-C  acetaminophen (TYLENOL) 325 MG tablet Take 650 mg by mouth every 6 (six) hours as needed for mild pain or headache.    [provider]  Calcium-Phosphorus-Vitamin D (CALCIUM GUMMIES PO) Take 2 each by mouth at bedtime.    [provider]  dicyclomine (BENTYL) 20 MG tablet Take 1 tablet (20 mg total) by mouth 2 (two) times daily. 11/10/18   Maudie Flakes, MD  Eszopiclone 3 MG TABS Take 3 mg by mouth at bedtime.  12/12/17   [provider]  etodolac (LODINE) 500 MG tablet Take 500 mg by mouth daily.     [provider]  irbesartan-hydrochlorothiazide (AVALIDE) 150-12.5 MG tablet Take 1 tablet by mouth daily.    [provider]  levonorgestrel (MIRENA) 20 MCG/24HR IUD 1 each by Intrauterine route once.    [provider]  levothyroxine (SYNTHROID, LEVOTHROID) 75 MCG tablet Take 75 mcg by mouth daily before breakfast.  12/31/17   [provider]  Magnesium 200 MG TABS Take 400 mg by mouth 2 (two) times daily.    [provider]  Multiple Vitamins-Minerals (MULTIVITAMIN ADULTS) TABS Take 1 tablet by mouth daily.    [provider]  ondansetron (ZOFRAN ODT) 4 MG disintegrating tablet Take 1 tablet (4 mg total) by mouth every 8 (eight) hours as needed for nausea or vomiting. 11/10/18   Maudie Flakes, MD  oxyCODONE (ROXICODONE) 5 MG immediate release tablet Take 1 tablet (5 mg total) by mouth every 4 (four) hours as needed for severe pain. 0/34/74   Leighton Ruff, MD  Vitamin D, Cholecalciferol, 25 MCG (1000 UT) CAPS Take 1,000 Units by mouth daily.    [provider]    Family History Family History  Problem Relation Age of Onset   Heart disease Mother    Hyperlipidemia  Mother    Hypertension Mother    Mental illness Mother    Hypertension Father    Heart disease Maternal Uncle     Social History Social History   Tobacco Use   Smoking status: Never   Smokeless tobacco: Never  Vaping Use   Vaping Use: Never used  Substance Use Topics   Alcohol use: Yes    Alcohol/week: 0.0 - 4.0 standard drinks of alcohol    Comment: most often, doesn't drink   Drug use: No     Allergies   Molds & smuts, Amoxicillin-pot clavulanate, Codeine, Erythromycin, and Flurbiprofen   Review of Systems Review of Systems  Constitutional:  Negative for chills and fever.   Eyes:  Negative for discharge and redness.  Gastrointestinal:  Negative for abdominal pain, nausea and vomiting.  Genitourinary:  Positive for dysuria. Negative for flank pain.  Musculoskeletal:  Negative for back pain.     Physical Exam Triage Vital Signs ED Triage Vitals  Enc Vitals Group     BP 07/02/22 1321 (!) 164/103     Pulse Rate 07/02/22 1321 92     Resp 07/02/22 1321 18     Temp 07/02/22 1321 97.8 F (36.6 C)     Temp Source 07/02/22 1321 Oral     SpO2 07/02/22 1321 96 %     Weight --      Height --      Head Circumference --      Peak Flow --      Pain Score 07/02/22 1320 4     Pain Loc --      Pain Edu? --      Excl. in GC? --    No data found.  Updated Vital Signs BP (!) 164/103 (BP Location: Left Arm)   Pulse 92   Temp 97.8 F (36.6 C) (Oral)   Resp 18   SpO2 96%      Physical Exam Vitals and nursing note reviewed.  Constitutional:      General: She is not in acute distress.    Appearance: Normal appearance. She is not ill-appearing.  HENT:     Head: Normocephalic and atraumatic.  Eyes:     Conjunctiva/sclera: Conjunctivae normal.  Cardiovascular:     Rate and Rhythm: Normal rate.  Pulmonary:     Effort: Pulmonary effort is normal.  Neurological:     Mental Status: She is alert.  Psychiatric:        Mood and Affect: Mood normal.        Behavior: Behavior normal.        Thought Content: Thought content normal.      UC Treatments / Results  Labs (all labs ordered are listed, but only abnormal results are displayed) Labs Reviewed  POCT URINALYSIS DIP (MANUAL ENTRY) - Abnormal; Notable for the following components:      Result Value   Blood, UA moderate (*)    Leukocytes, UA Small (1+) (*)    All other components within normal limits  URINE CULTURE    EKG   Radiology No results found.  Procedures Procedures (including critical care time)  Medications Ordered in UC Medications - No data to display  Initial Impression /  Assessment and Plan / UC Course  I have reviewed the triage vital signs and the nursing notes.  Pertinent labs & imaging results that were available during my care of the patient were reviewed by me and considered in my medical decision making (see chart  for details).    We will treat to cover UTI with Macrobid.  Urine culture ordered.  Recommended follow-up if no gradual improvement or with any further concerns.  Patient expresses understanding.  Final Clinical Impressions(s) / UC Diagnoses   Final diagnoses:  Acute cystitis without hematuria   Discharge Instructions   None    ED Prescriptions     Medication Sig Dispense Auth. Provider   nitrofurantoin, macrocrystal-monohydrate, (MACROBID) 100 MG capsule Take 1 capsule (100 mg total) by mouth 2 (two) times daily. 10 capsule Tomi Bamberger, PA-C      PDMP not reviewed this encounter.   Tomi Bamberger, PA-C 07/02/22 1413

## 2022-07-02 NOTE — ED Triage Notes (Signed)
Pt presents with slight vaginal irritation and discomfort when urinating.

## 2022-07-04 LAB — URINE CULTURE: Culture: 40000 — AB

## 2024-01-13 ENCOUNTER — Ambulatory Visit
Admission: RE | Admit: 2024-01-13 | Discharge: 2024-01-13 | Disposition: A | Discharge status: Home / Self Care | Source: Ambulatory Visit

## 2024-01-13 VITALS — BP 152/87 | HR 81 | Temp 98.2°F | Resp 18

## 2024-01-13 DIAGNOSIS — R42 Dizziness and giddiness: Secondary | ICD-10-CM | POA: Diagnosis not present

## 2024-01-13 DIAGNOSIS — J0100 Acute maxillary sinusitis, unspecified: Secondary | ICD-10-CM

## 2024-01-13 MED ORDER — DOXYCYCLINE HYCLATE 100 MG PO CAPS: 100.0000 mg | ORAL_CAPSULE | Freq: Two times a day (BID) | ORAL | 0 refills | Status: AC

## 2024-01-13 MED ORDER — MECLIZINE HCL 25 MG PO TABS: 25.0000 mg | ORAL_TABLET | Freq: Three times a day (TID) | ORAL | 0 refills | Status: AC | PRN

## 2024-01-13 MED ORDER — METHYLPREDNISOLONE ACETATE 80 MG/ML IJ SUSP
40.0000 mg | Freq: Once | INTRAMUSCULAR | Status: AC
  Administered 2024-01-13: 40 mg via INTRAMUSCULAR

## 2024-01-13 NOTE — ED Triage Notes (Signed)
 Pt reports sinus congestion and intermittent episodes of dizziness x2 days. Pt reports recovering from a cold that started last Thursday and this is just what is left over. Pt is taking sudafed with little relief. Not actively dizzy currently. Notes seasonal allergies as well. No allergy med use currently.

## 2024-01-13 NOTE — ED Provider Notes (Signed)
 [facility]    CSN: [id number] Arrival date & time: 01/13/24  1050      History   Chief Complaint Chief Complaint  Patient presents with   Nasal Congestion    I recently got over a cold, but I've had persistent sinus congestion and now I'm having spells of dizziness.  I don't feel sick except for the pressure in my head and the dizziness. - Entered by patient   Dizziness    HPI Janet Watson is a 63 y.o. female.   Patient presents today for evaluation of nasal congestion and intermittent episodes of dizziness for the last 2 days.  She reports that she is recovering from a cold that she has had for the last week.  She reports that she has tried Sudafed without significant improvement.  She denies active dizziness.  She has not been taking any allergy medication recently.  She denies any fever.  The history is provided by the patient.  Dizziness Associated symptoms: no diarrhea, no nausea, no shortness of breath and no vomiting     Past Medical History:  Diagnosis Date   ADHD (attention deficit hyperactivity disorder)    Arthritis    Hypertension     Patient Active Problem List   Diagnosis Date Noted   Allergic rhinitis 12/17/2021   Asthma 12/17/2021   Chronic insomnia 12/17/2021   Chronic osteoarthritis 12/17/2021   Encounter for general adult medical examination with abnormal findings 12/17/2021   Class 2 obesity 12/17/2021   Irritable bowel 12/17/2021   Liver cyst 12/17/2021   Rosacea 12/17/2021   Cholecystitis 11/11/2018   Pain in right knee 02/08/2018   Benign essential HTN 07/19/2015   Hypothyroidism 07/19/2015   ADD (attention deficit disorder) 07/19/2015   Plantar fasciitis 07/19/2015    Past Surgical History:  Procedure Laterality Date   CARPAL TUNNEL RELEASE Bilateral    CHOLECYSTECTOMY N/A 11/11/2018   Procedure: LAPAROSCOPIC CHOLECYSTECTOMY WITH INTRA OPERATIVE CHOLANGIOGRAM;  Surgeon: Joyce Nixon, MD;  Location: [facility];  Service:  General;  Laterality: N/A;   KNEE SURGERY Bilateral     OB History   No obstetric history on file.      Home Medications    Prior to Admission medications   Medication Sig Start Date End Date Taking? Authorizing Provider  celecoxib (CELEBREX) 200 MG capsule Take 1 capsule by mouth daily. 10/18/21  Yes [provider]  doxycycline  (VIBRAMYCIN ) 100 MG capsule Take 1 capsule (100 mg total) by mouth 2 (two) times daily for 7 days. 01/13/24 01/20/24 Yes Vernestine Gondola, PA-C  Eszopiclone 3 MG TABS Take 3 mg by mouth at bedtime.  12/12/17  Yes [provider]  etodolac (LODINE) 500 MG tablet Take 500 mg by mouth daily.    Yes [provider]  irbesartan-hydrochlorothiazide (AVALIDE) 150-12.5 MG tablet Take 1 tablet by mouth daily.   Yes [provider]  levothyroxine (SYNTHROID, LEVOTHROID) 75 MCG tablet Take 75 mcg by mouth daily before breakfast.  12/31/17  Yes [provider]  Magnesium 200 MG TABS Take 400 mg by mouth 2 (two) times daily.   Yes [provider]  meclizine  (ANTIVERT ) 25 MG tablet Take 1 tablet (25 mg total) by mouth 3 (three) times daily as needed for dizziness. 01/13/24  Yes Vernestine Gondola, PA-C  Multiple Vitamins-Minerals (MULTIVITAMIN ADULTS) TABS Take 1 tablet by mouth daily.   Yes [provider]  Vitamin D, Cholecalciferol, 25 MCG (1000 UT) CAPS Take 1,000 Units by mouth daily.  Yes [provider]  Vitamin E (VITAMIN E/D-ALPHA NATURAL) 268 MG (400 UNIT) CAPS 1 capsule   Yes [provider]  acetaminophen  (TYLENOL ) 325 MG tablet Take 650 mg by mouth every 6 (six) hours as needed for mild pain or headache.    [provider]  albuterol (VENTOLIN HFA) 108 (90 Base) MCG/ACT inhaler     [provider]  azithromycin (ZITHROMAX) 250 MG tablet TAKE 2 TABLETS BY MOUTH ON DAY 1, THEN TAKE 1 TABLET DAILY ON DAYS 2-5 Patient not taking: Reported on 01/13/2024    [provider]   bisacodyl (DULCOLAX) 5 MG EC tablet See admin instructions. Patient not taking: Reported on 01/13/2024    [provider]  Calcium-Phosphorus-Vitamin D (CALCIUM GUMMIES PO) Take 2 each by mouth at bedtime. Patient not taking: Reported on 01/13/2024    [provider]  dicyclomine  (BENTYL ) 20 MG tablet Take 1 tablet (20 mg total) by mouth 2 (two) times daily. Patient not taking: Reported on 01/13/2024 11/10/18   Edson Graces, MD  estradiol  (ESTRACE ) 0.1 MG/GM vaginal cream     [provider]  hyoscyamine (LEVSIN SL) 0.125 MG SL tablet     [provider]  levonorgestrel (MIRENA) 20 MCG/24HR IUD 1 each by Intrauterine route once.    [provider]  nitrofurantoin , macrocrystal-monohydrate, (MACROBID ) 100 MG capsule Take 1 capsule (100 mg total) by mouth 2 (two) times daily. Patient not taking: Reported on 01/13/2024 07/02/22   Vernestine Gondola, PA-C  ondansetron  (ZOFRAN  ODT) 4 MG disintegrating tablet Take 1 tablet (4 mg total) by mouth every 8 (eight) hours as needed for nausea or vomiting. Patient not taking: Reported on 01/13/2024 11/10/18   Edson Graces, MD  oxyCODONE  (ROXICODONE ) 5 MG immediate release tablet Take 1 tablet (5 mg total) by mouth every 4 (four) hours as needed for severe pain. Patient not taking: Reported on 01/13/2024 11/12/18   Joyce Nixon, MD  polyethylene glycol-electrolytes (GAVILYTE-N WITH FLAVOR PACK) 420 g solution     [provider]  predniSONE (DELTASONE) 20 MG tablet TAKE TWO TABLETS BY MOUTH ONCE DAILY WITH FOOD OR MILK FOR 3 DAYS Patient not taking: Reported on 01/13/2024    [provider]  valACYclovir (VALTREX) 1000 MG tablet     [provider]    Family History Family History  Problem Relation Age of Onset   Heart disease Mother    Hyperlipidemia Mother    Hypertension Mother    Mental illness Mother    Hypertension Father    Heart disease Maternal Uncle     Social History Social  History   Tobacco Use   Smoking status: Never   Smokeless tobacco: Never  Vaping Use   Vaping status: Never Used  Substance Use Topics   Alcohol use: Yes    Alcohol/week: 0.0 - 4.0 standard drinks of alcohol    Comment: most often, doesn't drink   Drug use: No     Allergies   Molds & smuts, Grass pollen([id number]-swt vern), Amoxicillin-pot clavulanate, Azelastine-fluticasone, Codeine, Erythromycin, and Flurbiprofen   Review of Systems Review of Systems  Constitutional:  Negative for chills and fever.  HENT:  Positive for congestion and sinus pressure. Negative for ear pain and sore throat.   Eyes:  Negative for discharge and redness.  Respiratory:  Negative for cough, shortness of breath and wheezing.   Gastrointestinal:  Negative for abdominal pain, diarrhea, nausea and vomiting.  Neurological:  Positive for dizziness.  Physical Exam Triage Vital Signs ED Triage Vitals  Encounter Vitals Group     BP 01/13/24 1126 (!) 152/87     Systolic BP Percentile --      Diastolic BP Percentile --      Pulse Rate 01/13/24 1126 81     Resp 01/13/24 1126 18     Temp 01/13/24 1126 98.2 F (36.8 C)     Temp Source 01/13/24 1126 Oral     SpO2 01/13/24 1126 96 %     Weight --      Height --      Head Circumference --      Peak Flow --      Pain Score 01/13/24 1127 6     Pain Loc --      Pain Education --      Exclude from Growth Chart --    No data found.  Updated Vital Signs BP (!) 152/87 (BP Location: Left Arm)   Pulse 81   Temp 98.2 F (36.8 C) (Oral)   Resp 18   SpO2 96%   Visual Acuity Right Eye Distance:   Left Eye Distance:   Bilateral Distance:    Right Eye Near:   Left Eye Near:    Bilateral Near:     Physical Exam Vitals and nursing note reviewed.  Constitutional:      General: She is not in acute distress.    Appearance: Normal appearance. She is not ill-appearing.  HENT:     Head: Normocephalic and atraumatic.     Nose: Congestion present.      Mouth/Throat:     Mouth: Mucous membranes are moist.     Pharynx: No oropharyngeal exudate or posterior oropharyngeal erythema.  Eyes:     Conjunctiva/sclera: Conjunctivae normal.  Cardiovascular:     Rate and Rhythm: Normal rate and regular rhythm.     Heart sounds: Normal heart sounds. No murmur heard. Pulmonary:     Effort: Pulmonary effort is normal. No respiratory distress.     Breath sounds: Normal breath sounds. No wheezing, rhonchi or rales.  Skin:    General: Skin is warm and dry.  Neurological:     Mental Status: She is alert.  Psychiatric:        Mood and Affect: Mood normal.        Thought Content: Thought content normal.      UC Treatments / Results  Labs (all labs ordered are listed, but only abnormal results are displayed) Labs Reviewed - No data to display  EKG   Radiology No results found.  Procedures Procedures (including critical care time)  Medications Ordered in UC Medications  methylPREDNISolone  acetate (DEPO-MEDROL ) injection 40 mg (40 mg Intramuscular Given 01/13/24 1144)    Initial Impression / Assessment and Plan / UC Course  I have reviewed the triage vital signs and the nursing notes.  Pertinent labs & imaging results that were available during my care of the patient were reviewed by me and considered in my medical decision making (see chart for details).    Suspect likely sinusitis with secondary vertigo.  Will treat with antibiotic as well as meclizine .  Patient reports she does not do well with Augmentin so doxycycline  prescribed.  She requested steroid injection as this has helped with sinus infections due to allergic rhinitis in the past.  Steroid injection administered as requested.  Recommended follow-up if no gradual improvement with any worsening symptoms.  Final Clinical Impressions(s) / UC Diagnoses  Final diagnoses:  Acute non-recurrent maxillary sinusitis  Vertigo   Discharge Instructions   None    ED Prescriptions      Medication Sig Dispense Auth. Provider   meclizine  (ANTIVERT ) 25 MG tablet Take 1 tablet (25 mg total) by mouth 3 (three) times daily as needed for dizziness. 30 tablet Jami Mcclintock F, PA-C   doxycycline  (VIBRAMYCIN ) 100 MG capsule Take 1 capsule (100 mg total) by mouth 2 (two) times daily for 7 days. 14 capsule Vernestine Gondola, PA-C      PDMP not reviewed this encounter.   Vernestine Gondola, PA-C 01/13/24 1932
